# Patient Record
Sex: Male | Born: 1965 | Race: White | Hispanic: No | Smoking: Never smoker
Health system: Southern US, Community
[De-identification: ages and names within clinical notes are randomized; demographics above are authoritative.]

## PROBLEM LIST (undated history)

## (undated) DIAGNOSIS — R0600 Dyspnea, unspecified: Secondary | ICD-10-CM

## (undated) DIAGNOSIS — E1142 Type 2 diabetes mellitus with diabetic polyneuropathy: Secondary | ICD-10-CM

## (undated) DIAGNOSIS — M1990 Unspecified osteoarthritis, unspecified site: Secondary | ICD-10-CM

## (undated) DIAGNOSIS — J45909 Unspecified asthma, uncomplicated: Secondary | ICD-10-CM

## (undated) DIAGNOSIS — R569 Unspecified convulsions: Secondary | ICD-10-CM

## (undated) DIAGNOSIS — I272 Pulmonary hypertension, unspecified: Secondary | ICD-10-CM

## (undated) DIAGNOSIS — E119 Type 2 diabetes mellitus without complications: Secondary | ICD-10-CM

## (undated) DIAGNOSIS — G4733 Obstructive sleep apnea (adult) (pediatric): Secondary | ICD-10-CM

## (undated) DIAGNOSIS — J449 Chronic obstructive pulmonary disease, unspecified: Secondary | ICD-10-CM

## (undated) DIAGNOSIS — F32A Depression, unspecified: Secondary | ICD-10-CM

## (undated) DIAGNOSIS — G118 Other hereditary ataxias: Secondary | ICD-10-CM

## (undated) DIAGNOSIS — I10 Essential (primary) hypertension: Secondary | ICD-10-CM

## (undated) DIAGNOSIS — E785 Hyperlipidemia, unspecified: Secondary | ICD-10-CM

## (undated) DIAGNOSIS — G4730 Sleep apnea, unspecified: Secondary | ICD-10-CM

## (undated) DIAGNOSIS — R079 Chest pain, unspecified: Secondary | ICD-10-CM

## (undated) DIAGNOSIS — M75102 Unspecified rotator cuff tear or rupture of left shoulder, not specified as traumatic: Secondary | ICD-10-CM

## (undated) DIAGNOSIS — G4700 Insomnia, unspecified: Secondary | ICD-10-CM

## (undated) DIAGNOSIS — I209 Angina pectoris, unspecified: Secondary | ICD-10-CM

## (undated) HISTORY — DX: Type 2 diabetes mellitus without complications: E11.9

## (undated) HISTORY — PX: COLONOSCOPY: SHX174

## (undated) HISTORY — PX: URETER SURGERY: SHX823

## (undated) HISTORY — PX: HERNIA REPAIR: SHX51

## (undated) HISTORY — DX: Sleep apnea, unspecified: G47.30

## (undated) HISTORY — DX: Pulmonary hypertension, unspecified: I27.20

---

## 2008-12-29 ENCOUNTER — Ambulatory Visit
Admit: 2008-12-29 | Discharge: 2008-12-29 | Disposition: A | Discharge status: Home / Self Care | Payer: Self-pay | Source: Ambulatory Visit | Attending: Neurology | Admitting: Neurology

## 2009-01-06 ENCOUNTER — Ambulatory Visit
Admit: 2009-01-06 | Discharge: 2009-01-06 | Disposition: A | Discharge status: Home / Self Care | Payer: Self-pay | Source: Ambulatory Visit | Attending: Neurology | Admitting: Neurology

## 2009-03-11 ENCOUNTER — Ambulatory Visit: Payer: Self-pay | Admitting: Neurology

## 2009-03-22 NOTE — Progress Notes (Addendum)
 Reason For Visit   Dear Dr. Margette Fast     I saw [name] for a follow-up for his episodic ataxia and for his   activity triggered bilateral arm weakness and numbness in the Neurological   Clinic at [facility] this afternoon.  HPI   On his last visit [name] was complaining about worsening episodes of   ataxia secondary to his hereditary disorder for which he was actually   referred to the Neuromuscular Clinic at [facility]. But for some reason he never   got to schedule an appointment there.      He was also bothered by a worsening, mainly activity triggered bilateral   arm weakness, numbness (mainly in the ulnar territory) and soreness in the   setting of chronic degenerative changes of his cervical spine. Back in   October 2010 he had a well preserved strength, muscle bulk and only   slightly impaired sensory deficits in his extremities. He was also   complaining of a slowly progressing urinary urge-incontinence and erectile   dysfunction. According to [name] none of these symptoms have changed   or gotten worse over the course of the last few months.      In the meantime he had a MRI of his cervical spine as well as nerve   conduction studies in order to distinguish between a peripheral neuropathy   and a radiculopathy within the cervical spine. The MRI showed a cervical   spondylosis which is most pronounced at C6-C7 with right foraminal stenosis   and central canal narrowing but no signs of myelopathy or herniated discs.   These results are also consistent with the findings of the nerve conduction   study which shows a mild chronic right C7 radiculopathy and a predominantly   sensory polyneuropathy especially in the lower extremities. His HbA1c was   6% and the rest of his blood-work including thyroid function tests were   within normal limits.  Current Meds   Gabapentin 300 MG Capsule;TAKE 1 CAPSULE 3 TIMES DAILY.; RPT  Simvastatin 20 MG Tablet;TAKE 1 TABLET DAILY.; RPT  Lisinopril 20 MG  Tablet;TAKE 1 TABLET DAILY.; RPT  Atenolol 100 MG Tablet;TAKE 1 TABLET DAILY.; RPT.  Results   MR CERVICAL SPINE W/O CONTRST   06 Jan 2009 04:17 PM  -   MR CERVICAL SPINE W/O CONTRST  Exam Site: [facility]     MR cervical spine ICD9 code: 722.4     Jan 06, 2009 4:17:00 PM     MR IMAGING OF THE CERVICAL SPINE WITHOUT CONTRAST     CLINICAL INFORMATION: Patient complaining of intermittent bilateral  numbness and pain as well as weakness with exercise. Evaluation to  rule out myelopathy or herniated disc is requested.     PROCEDURE (MR301): Sagittal T1- and T2-weighted images were obtained  through the cervical spine, as well as axial proton density and  T2-weighted images.     COMPARISON: None.     FINDINGS: There is a straight spine. Alignment of the anterior,  middle, and posterior cervical elements is anatomic. There no  fractures or dislocations. Multilevel degenerative disease as  detailed below:     C2-C3: Central canal and neuroforamina are patent.     C3-C4: There is uncovertebral and facet hypertrophy resulting in  moderate to severe left foraminal stenosis. There is no central canal  stenosis.     C4-C5: There is no foramen or central canal stenosis.  C5-C6: There is mild to moderate right foraminal stenosis from facet  hypertrophy.     C6-C7: There is a right paracentral disc bulge resulting in moderate  to severe right foraminal stenosis and moderate central canal  stenosis with rotation of the cord on image 21 series 5.     The cord exhibits normal signal throughout its course without focal  abnormality. There is a T3 hemangioma. The regional soft tissues are  unremarkable.     IMPRESSION:     Spondylosis most pronounced at C6-C7 with right foraminal stenosis  and central canal narrowing.           Signed by: Lemar Livings - Radiologist  Signed by: Langston Reusing - Radiologist  Exam requested by: Della Goo M.D.  Vital Signs   Recorded by GOVAN,DEREK on 11 Mar 2009 03:41 PM  BP:157/93,  LUE,  Sitting,   HR: 78 b/min,   Weight: 243.0 lb,   Pain Scale: 6.  Physical Exam   GENERAL:    Well nourished, well-developed, in no apparent distress.   Heart sounds   regular.    Lungs clear.  Carotid exam reveals no bruit.  Peripheral pulses were   present.    Cervical spine tender to palpation.     CRANIAL NERVES:  Visual fields were full.  Fundoscopic exam was normal.    Pupils were 3 mm to 2 mm bilaterally.  EOMs were full without nystagmus.   Facial sensation and musculature were symmetric.  Palatal elevation was   symmetric, and tongue was midline.     MENTAL STATUS: Alert and oriented x 3 with no deficits of speech or   language.  Mood and affect were appropriate to situation.     MOTOR:  Bulk and tone were normal throughout.  Strength was 5/5 in   shoulders, biceps, triceps, hip flexors, ankle dorsi-and plantar flexors.    Finger-flexors 4+/5 bilateral. No pronator drift.     SENSATION:  Reduced pin and light touch over the ulnar territory   (C8/C7-dermatome) but preserved sense of vibration in all 4 extremities.     COORDINATION:  Finger to nose normal, positive Romberg but otherwise no   ataxia on exam.       GAIT:  Normal heel and toe gait. Very unsteady tandem and slightly unsteady   blind gait. Pt. not able to stand on one leg due to dysbalance.     REFLEXES:  Very brisk but symmetric upper and lower reflexes with downgoing   toes.  Assessment   Dennis Curtis is a 44 year old gentleman with a history of hereditary   episodic ataxia, chronic back an neck pain as well as left ulnar nerve   neuropathy who was found to have a right C7 radiculopathy secondary to a   cervical spondylosis with a right-sided foraminal stenosis. The combination   of his left ulnar neuropathy and the radiculopathy on the right could   explain his activity triggered numbness and weakness. The predominantly   sensory polyneuropathy in the lower extremities certainly contributes to   his gait  unsteadiness.       But none of these changes seem to be acute or severe enough to justify   acute surgical intervention. His strength and muscle bulk is well   preserved, his pain fairly well controlled with Gabapentin and there is no   sings of acute loss of bowel- or bladder-function. Regardless of the fact   that I told him that  his diagnosis is usually treated conservatively Mr.   Curtis wanted to get the opinion of a neurosurgeon on his radiculopathy.   He is also eager to be started on a treatment for his episodic ataxia since   they are significantly impairing his every day life.  Plan   1. Referral to Neurosurgery to discuss surgical options for his right C7   radiculopathy.  2. Referral to the Neuromuscular Clinic (preferred Dr. Ronal Fear who diagnosed   him initially) to discuss possible medical treatment of his episodic ataxia  3. Dennis Curtis was told to re-schedule a follow-up appointment with me in   clinic as needed since his work-up is completed so far.     Dennis Curtis has also been personally seen and evaluated by Dr. Ancil Boozer.     Please feel free to contact me with any questions or concerns!     Sincerely,  Della Goo  PGY-1 Neurology.  Signature   Electronically signed by: Della Goo  MD - Res.; 03/11/2009 9:08   PM EST; Co-author.  Electronically signed by: Kristian Covey  M.D.; 03/22/2009 10:36 AM EST;   Preceptor.

## 2009-04-12 ENCOUNTER — Ambulatory Visit
Admit: 2009-04-12 | Discharge: 2009-04-12 | Disposition: A | Discharge status: Home / Self Care | Payer: Self-pay | Source: Ambulatory Visit | Attending: Nephrology | Admitting: Nephrology

## 2009-04-12 ENCOUNTER — Ambulatory Visit: Payer: Self-pay | Admitting: Nephrology

## 2009-04-12 LAB — VITAMIN B12: Vitamin B12: 378 pg/mL (ref 211–946)

## 2009-04-13 LAB — GLUCOSE TOLERANCE, 2 HOURS
Glucose,2 HR: 87 mg/dL
Glucose,Fasting: 110 mg/dL

## 2009-04-13 LAB — METHYLMALONIC ACID, SERUM: Methylmalonic Acid: <0.1 umol/L (ref 0.00–0.40)

## 2009-04-20 NOTE — Progress Notes (Addendum)
 Neuromuscular Disease Clinic Note   Dear Dr. Consuello Bossier:           We had the pleasure of seeing your patient, Dennis Curtis, in the   Neuromuscular Disease Clinic today.  He is a 44 year old male being seen in   consultation for Episodic Ataxia type 1.     His symptoms of Episodic Ataxia was first noted at age 75-3. He was told   that he always walks stumble. He has had "dizzy spells" since he was in   primary school. His spells are described as sudden loss of muscle control,   whole body shaking, loss of balance, spinning vision both horizontal and   vertical directions, and loss of coordination of fine motor movement.   Occasionally, he reports double vision and nausea/vomiting. His spells   typically last 5-15 minutes. Increased physical activities, exercise,   stress, sleep deprivation, and fasting usually aggravate his spells. He   states that he has at least 3-4 spells per week, sometimes he has multiple   spells per day, particularly when he plays with his grandchildren. He feels   completely normal between the spells. He has not fallen. He has not taken   medications for his spells. He has no history of migraine headache,   seizure, or loss of consciousness. He was recruited for EA study in the   past but did not follow up with [place].     He was evaluated in our general neurology clinic for neck pain, bilateral   hand numbness and upper extremity fatigue. 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 He noted upper   extremity fatigue and hand numbness after pulling several pallets.   Sometimes, he dropped the pallets because of the arm fatigue and numbness.   He had no neck pain during these events. His arm fatigue resolved after   rest within 10-15 minutes. He has been off work since December 2009 because   of his arm fatigue.     He also has chronic neck pain. Pain is sharp  and sometimes shooting down to   right shoulder and arm. He reports constant numbness in both feet and   intermittent numbness in the left hand. He had EMG performed in 2008 which   showed a left moderate ulnar neuropathy at the elbow. Another EMG was   performed in 2010 which showed a mild right chronic C7 radiculopathy, a   predominantly sensory polyneuropathy, and myokymia on needle EMG testing.   Subsequently, he had MRI cervical spine done which showed cervical   spondylosis, which is most pronounced at C6-C7 with right foraminal   stenosis with no signs of myelopathy or herniated discs. His neuropathy   workup included normal hemoglobin A1C, fasting glucose 141 mg/dL, and   normal TSH.   .  Current Meds   Atenolol 100 MG Tablet;TAKE 1 TABLET DAILY.; RPT  Lisinopril 20 MG Tablet;TAKE 1 TABLET DAILY.; RPT  Simvastatin 20 MG Tablet;TAKE 1 TABLET DAILY.; RPT  Gabapentin 300 MG Capsule;TAKE 1 CAPSULE 3 TIMES DAILY.; RPT.  Allergies   No Known Drug Allergy.  PMH   1. Episodic ataxia type I  2. Hypertension  3. S/P herniorrhaphy and uretral surgery  4. Hypercholesterolemia  .  Family Hx   His family history is significant for Episodic Ataxia type 1 (EA-1).  His   mother, brother, his mother's sister and one of his cousins were diagnosed   and followed up in our clinic. His mother and aunt were also diagnosed with   Rheumatoid Arthritis. He has no children.  .  Personal Hx   He does not smoke cigarette or use recreational drug. He drinks beer   occasionally. He used to work for [employer] but has been off work for a   year. He is on DSS service and planning to apply for social disability.   Currently, he lives with his wife and step daughter. There is a high level   of stress going on as he has to take care of his wife who has CHF and a   bipolar disorder and they are going to move to 2-bedroom apartment very   soon.  .  ROS   CONSTITUTIONAL: Appetite good, no fevers, night sweats or weight loss  HEENT: No rhinorrhea or  post nasal drip, no tinnitus, blurry vision, double   vision, difficulty swalloing  CV: No chest pain, shortness of breath or peripheral edema  RESPIRATORY: No cough, wheezing or dyspnea  GI: No nausea/vomiting, abdominal pain, or change in bowel habits  GU: No dysuria, urgency or incontinence  MUSCULOSKELETAL: No muscle pain, joint pain, joint swelling  NEURO: No headache, weakness, diplopia, ptosis, dysphagia, dysarthria,   bowel and bladder symptoms. He has numbness in both feet and finger tips.  All other systems reviewed are unremarkable.  .  Vital Signs   Recorded by Fabbozzi-Tomaka,Delia on 12 Apr 2009 08:59 AM  BP:164/100,  RUE,  Sitting,   HR: 71 b/min,   Weight: 244.5 lb,   Pain Scale: 4.  Physical Exam   General examination:  He is in no acute distress and cooperative.  Lungs   are clear by auscultation.  Cardiac exam reveals normal heart sound with no   murmur. Peripheral pulses are intact. Atrophic skin change is noted in both   legs.      Neurological Examination: He is alert and fully oriented.  His speech is   normal.  Memory and attention are intact.       Cranial Nerves:  Visual field is intact. Fundi are sharp but A:V ratio is   2:3. Extraocular movement is intact. There is no overshoot on saccadic eye   movement but his pursuit is not smooth. No nystagmus is noted in all   directions. He has subtle eyelid and lip myokymia. Facial sensation is   intact to light touch. Lid and lip closure strength is normal. Hearing is   intact to finger rubbing.  Palate, jaw, shoulder shrug and tongue strength   is full. Speech is mildly dysarthric. There is no tongue atrophy or   fasciculation.     Motor: He has bilateral atrophy of EDB muscles. Myokymia is again noted in   bilateral lower extremities. Muscle tone is normal. Manual muscle testing   is normal except he could not spread his toes. He is able to perform deep   knee bends, toe walking and heel walking with no difficulty.     Sensory:  Decreased pin  and light touch sensation to ankles. Proprioception   and vibration are intact both toes.     DTR (R/L): 3/3 biceps, 2/2 triceps, 3/3 brachioradialis, 2/2 patella, and   1/1 archilles reflexes. Plantar response is flexor.      Coordination: Fine tremors are noted in both upper extremity. Finger to   nose,  finger tapping and heel to shin are intact. There is no   dysdiadochokinesia. He is able to perform tandem with difficulty and   requires partial support.      Gait: His casual gait is narrow and appears to be normal. Romberg is   present both eyes close and open.  .  Assessment   1. Episodic Ataxia type 1 (EA-1)     COMMENT:      His episodic ataxia spells are clearly associated with physical activities   and stress. Unfortunately, EA type 1 has unpredictable response to   acetazolamide compare to EA type 2. Some patients do response with   antiepileptic agents and sometimes with low dose anxiolytic agents. Since   Mr. Bourque is already taking low dose gabapentin for his neck pain,   titrating the dose might also benefits for his EA spells. If this does not   help, carbamazepine or low dose anxiolytic agent should be considered as   the next step.      His upper extremity fatigue is unclear etiology but less likely related   with EA spells given he has no other symptoms of his spell during the   fatigue. Perhaps, this symptom is contributed by C7 radiculopathy and ulnar   neuropathy. There is evidence of chronic denervation observed from the   muscle biopsy findings of EA-1 patients. His sensory neuropathy might be   attributed to his underlying channelopathy, however reversible and more   common causes of sensory neuropathy need to be excluded as well given he   has impaired fasting glucose.     2. Peripheral Neuropathy     3. Hypertension - inadequately controlled     4. Hypercholesterolemia     5. Left ulnar neuropathy     6. Right C7 radiculopathy  .  Plan   1. We provided him lab requisition for 2-hour  glucose tolerance test,   vitamin B12 and methylmalonic acid for neuropathy workup.     2. We provided him an instruction to slowly titrate his gabapentin dose. We   asked him to add 300 mg capsule to his e

## 2009-05-03 ENCOUNTER — Ambulatory Visit
Admit: 2009-05-03 | Discharge: 2009-05-03 | Disposition: A | Discharge status: Home / Self Care | Payer: Self-pay | Source: Ambulatory Visit | Attending: Nephrology | Admitting: Nephrology

## 2009-06-24 ENCOUNTER — Ambulatory Visit: Payer: Self-pay | Admitting: Neurosurgery

## 2009-06-24 ENCOUNTER — Ambulatory Visit: Payer: Self-pay | Admitting: Rehabilitative and Restorative Service Providers"

## 2009-07-08 ENCOUNTER — Ambulatory Visit: Payer: Self-pay | Admitting: Neurosurgery

## 2009-07-08 ENCOUNTER — Ambulatory Visit: Payer: Self-pay | Admitting: Rehabilitative and Restorative Service Providers"

## 2009-07-09 LAB — PAIN CLINIC PROFILE
Amphetamine,UR: NEGATIVE
Benzodiazepinen,UR: NEGATIVE
Cocaine/Metab,UR: NEGATIVE
Opiates/Oxycodone,Ur: NEGATIVE
THC Metabolite,UR: NEGATIVE

## 2009-07-10 LAB — FENTANYL, URINE: Fentanyl,UR: NEGATIVE

## 2009-07-20 ENCOUNTER — Ambulatory Visit: Payer: Self-pay | Admitting: Neurosurgery

## 2009-08-09 ENCOUNTER — Ambulatory Visit: Payer: Self-pay | Admitting: Neurosurgery

## 2009-09-27 ENCOUNTER — Ambulatory Visit: Payer: Self-pay | Admitting: Neurosurgery

## 2009-10-28 ENCOUNTER — Ambulatory Visit: Payer: Self-pay | Admitting: Neurosurgery

## 2009-11-01 NOTE — Progress Notes (Signed)
Dennis Curtis follows up after myofascial trigger point injections  targeting the paracervical muscles of the superior trapezius and splenius  capitis on Jul 20, 2009.  The patient was lost to follow-up.  Today, he  recalls marked reduction in pain intensity for 3 weeks after the procedure  where he was essentially pain free from the "stabbing" neck pain.  He  experienced improvement in range of motion and activity tolerance.    The patient describes persistent axial predominant posterior cervical pain,  rating his pain intensity today at 5/10.  He states he was unable to follow  through with physical therapy or follow-up visits due to a lack of  transportation.  There is no mass transit where he resides.  He has  otherwise been well.  He denies any change in his health status, falls, or  trauma.  He denies any change in his pain quality or location.    PHYSICAL EXAMINATION:  Blood pressure:  148/102.  Pulse:  74.  Respirations:  16.  Afebrile.  This is a pleasant overweight adult male who  is sitting upright in the examination chair.  No evidence of sedation or  psychomotor slowing.  Affect is broad and stable.  Neck is supple.  Range  of motion is full with flexion and extension, decreased with rotation to  the left with report of increased right-sided neck pain.  Lhermitte's is  negative.  Hoffmann's is positive.  Strength is full for all muscle groups  in the bilateral upper and lower extremities.  Tone is normal.  Gait is  steady, narrow based, and nonantalgic.  He can arise to his heels and  toes.    IMPRESSION/PLAN:  This is a 44 year old male with cervical spondylosis at  C6-C7 and central and neural foraminal stenosis with paracervical  myofascial pain.  We agreed to repeat the myofascial trigger point  injections due to previous positive response.  The patient will schedule a  physical therapy visit on the day of the injection for a refresher on the  physical therapy exercises and follow up with Physical  Therapy.  He was  encouraged to perform the physical therapy exercises at home on a regular  basis.    Thank you for allowing [place] to assist in the care of this patient.                                                                     Dictated by:                                                               Kirke Shaggy, NP  Electronically Signed and Finalized by  Talbert Forest  Rennis Golden, NP 10/31/2009 22:10                                                                                                                             ___________________________________________                                                               Kirke Shaggy, NP  DD:   10/28/2009  DT:   10/29/2009 11:39 A  ZOX/[id number]  [id number]    cc:   Karen Kays, MD        Edmund Hilda, MD

## 2009-11-02 ENCOUNTER — Ambulatory Visit: Payer: Self-pay | Admitting: Neurosurgery

## 2009-11-02 ENCOUNTER — Encounter: Payer: Self-pay | Admitting: Gastroenterology

## 2009-11-02 ENCOUNTER — Ambulatory Visit: Payer: Self-pay | Admitting: Rehabilitative and Restorative Service Providers"

## 2009-11-03 DIAGNOSIS — M542 Cervicalgia: Secondary | ICD-10-CM | POA: Insufficient documentation

## 2009-11-11 ENCOUNTER — Ambulatory Visit: Payer: Self-pay | Admitting: Rehabilitative and Restorative Service Providers"

## 2009-11-29 ENCOUNTER — Ambulatory Visit: Payer: Self-pay | Admitting: Neurosurgery

## 2009-12-02 ENCOUNTER — Ambulatory Visit: Payer: Self-pay | Admitting: Pain Medicine

## 2009-12-12 NOTE — Progress Notes (Signed)
 Dennis Curtis follows up regarding persistent posterior cervical pain.  The patient underwent myofascial trigger point injections on November 02, 2009, targeting the bilateral superior trapezii and levator scapulae on the  left.  The patient experienced transient reduction in pain intensity and  improvement in range of motion.  He states the pain returned to baseline.  He has been inconsistent in follow up with physical therapy that he  attributes to lack of transportation and his "poor memory."  The patient  denies any radiation of pain to the upper extremities.  He denies any  alteration in gait or change in bowel or bladder function.    Current medications: Gabapentin 300 mg 1 tab t.i.d., lisinopril 20 mg  daily, simvastatin 20 mg daily, atenolol 100 mg daily.    Physical examination: Blood pressure 157/98.  Pulse 75.  Respirations 15.  Afebrile.  He rates his present pain intensity as 8/10.  This is a  pleasant, adult male who is sitting upright in the exam chair.  His affect  is broad and stable.  Speech is fluent and nondysarthric.  There are no  significant pain behaviors.  His neck is supple.  Cervical range of motion  is full with flexion and extension.  Lhermitte's is negative. Hoffmann's is  negative.  He reports pain with palpation of the myofascial structures in  the cervical region.  Strength is full in the C5 through T1 and L3 through  S1 dermatomes.  Hoffmann's is positive (due to myokymia).  Toes are  downgoing.  Gait is steady, narrow based, and nonantalgic.    Impression/plan: This is a 44 year old male with persistent myofascial  cervical pain in the context of myokymia.  Recommend a TENS unit and resume  physical therapy.  Prescriptions were provided for both.  We will follow up  with him in 3 months' time.                                                                     Dictated by:                                                               Dennis Shaggy, Dennis Curtis  Electronically Signed and Finalized by  Dennis Shaggy, NP 12/12/2009 12:25  ___________________________________________                                                               Dennis Shaggy, Dennis Curtis  DD:   12/03/2009  DT:   12/06/2009 10:20 A  [id number]  [id number]    cc:   Ferdie Ping, MD        Rickard Patience, MD        Edmund Hilda, MD

## 2010-02-01 ENCOUNTER — Ambulatory Visit: Payer: Self-pay | Admitting: Pain Medicine

## 2010-03-14 NOTE — Procedures (Signed)
Procedure   [facility]  Procedure Note  [facility] Department of Neurosurgery  [address] [place], [place]  [address]  Phone: [phone]  Fax: [phone]     Patient Name: Dennis Curtis [facility]  Medical Record Number: [id number]  Date of Procedure: 11/02/2009  Location of Procedure: [facility] / [address] [facility]     Procedure Name:  Trigger Point Injection  Provider:  Trude Mcburney MD  Indication: Right and LeftSuperior TrapeziusLevator Scapulae (left)  ICD9:  723.1  CPT:  20553     There has been no interval change in the anatomic distribution, intensity,   or quality of the patients pain since the last evaluation. As per the   treatment plan outlined at that visit, the patient undergoes myofascial   trigger point injection today. The patient is not currently on any blood   thinning medication and has had no recent symptoms or signs of infection   including fever or chills.     The procedure was reviewed with the patient in detail including the risks   of bleeding, infection, nerve injury, failure to relieve pain, increased   pain, allergic reaction,  collapsed lung, and headache. The patient   demonstrated understanding. Informed consent was obtained.      PROCEDURE:       The patient was prepped in the typical sterile fashion with betadine.  4   reproducible myofascial trigger points over the target muscles on the:   right and left superior trapezius ], levator scapulae  were identified and   marked.     Using a 27 gauge 1-1/4" needle, the bevel was advanced in the belly of the   muscle at a total of 4 sites and 5 cc of 0.50 percent bupivicaine and 40 mg   of methylprednisolone was delivered after a muscle twitch was elicited.    There was negative aspiration for heme prior to injection.      The patient tolerated the procedure well.  He remained hemodynamically   stable throughout. He was discharged at her baseline neurologic state    following check of  vital signs and review of standard discharge   instructions.     The patient will follow up in 4 weeks time.  Signature   Electronically signed by: Trude Mcburney  MD Attend.; 11/02/2009 1:21 PM EST.

## 2010-03-14 NOTE — Procedures (Signed)
Procedure   [facility]  Procedure Note  [facility] [place] Department of Neurosurgery  [address] [place], [place]  [address]  Phone: [phone]  Fax: [phone]     Patient Name: Dennis Curtis [facility]  Medical Record Number: [id number]  Date of Procedure: 07/20/2009  Location of Procedure: [facility] / [address] [facility] [place]     Procedure Name:  Trigger Point Injection  Provider:  Trude Mcburney MD  Indication: Right and LeftSuperior TrapeziusSplenius Capitis  ICD9:  723.1  CPT:  20553     There has been no interval change in the anatomic distribution, intensity,   or quality of the patients pain since the last evaluation. As per the   treatment plan outlined at that visit, the patient undergoes myofascial   trigger point injection today.  The patient has neurophysiologic evidence   of a remote C7 radiculopathy but his symptoms are axial predominant, evoked   by direct palpation of the paraspinal muscles, and not clearly lateralized   (i.e. bilateral) with his EMG/NCS. For this reason, a myofascial target for   the pain was addressed today. The patient is not currently on any blood   thinning medication and has had no recent symptoms or signs of infection   including fever or chills.     The procedure was reviewed with the patient in detail including the risks   of bleeding, infection, nerve injury, failure to relieve pain, increased   pain, allergic reaction,  collapsed lung, and headache. The patient   demonstrated understanding. Informed consent was obtained.      PROCEDURE:       The patient was prepped in the typical sterile fashion with betadine.  4   reproducible myofascial trigger points over the target muscles on the:   right and leftsuperior trapezius splenius capitis],   were identified and   marked.     Using a 27 gauge 1-1/4" needle, the bevel was advanced in the belly of the   muscle at a total of 4 sites  and 5 cc of 0.50 percent  bupivicaine and 40   mg of methylprednisolone was delivered after a muscle twitch was elicited   in the aforementioned muscle groups.   There was negative aspiration for   heme prior to injection.      The patient tolerated the procedure well.  He remained hemodynamically   stable throughout. He was discharged at her baseline neurologic state   following check of  vital signs and review of standard discharge   instructions.     The patient will follow up in 4 weeks time.  Signature   Electronically signed by: Trude Mcburney  MD Attend.; 07/20/2009 9:11 AM EST.

## 2010-03-14 NOTE — Miscellaneous (Unsigned)
 Continuity of Care Record  Created: todo  From: ,   From:   From: [employer], EHR v10.2.7.53  To: Tressie Stalker  Purpose: Patient Use;       Problems  Diagnosis: Cervicalgia (723.1)   Diagnosis: Diabetes Mellitus (250.00)   Problem: Injection Of Trigger Point(S) Three Or More Muscle Group(S)    Alerts  Allergy - No Known Drug Allergy     Medications  Atenolol 100 MG Tablet; TAKE 1 TABLET DAILY. ; RPT   Gabapentin 300 MG Capsule; TAKE 1 CAPSULE 3 TIMES DAILY. ; RPT   Lisinopril 20 MG Tablet; TAKE 1 TABLET DAILY. ; RPT   MetFORMIN HCl 500 MG Tablet ; RPT   Physical Therapy; myofascial pain , lumbar strenghtening and stretching   program ; Rx   Physical Therapy; TENS unit for axial cervical myofascial pain ; Rx   Physical Therapy; Physical Therapy: cervical myofascial painCervical   Traction ; Rx   Simvastatin 20 MG Tablet; TAKE 1 TABLET DAILY. ; RPT
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
## 2010-03-28 ENCOUNTER — Ambulatory Visit: Payer: Self-pay | Admitting: Neurosurgery

## 2010-04-21 ENCOUNTER — Ambulatory Visit: Payer: Self-pay | Admitting: Neurosurgery

## 2010-04-21 DIAGNOSIS — E119 Type 2 diabetes mellitus without complications: Secondary | ICD-10-CM | POA: Insufficient documentation

## 2010-04-21 NOTE — Letter (Signed)
 April 21, 2010        RE:   Dennis Curtis, Dennis Curtis  DOB:  Apr 28, 1965  Unit#: [id number]    Dennis Curtis reports significant improvement in his left-sided axial  predominant posterior cervical pain.  He attributes this benefit to a  course of physical therapy, as well as the trigger point injection he  underwent 7 months ago in August.  He continues to have paresthesias in his  hands bilaterally.  His symptoms are somewhat more pronounced on the left  than on the right.  He denies any new weakness.  The tingling occurs in  both hands.  It is not necessarily associated with 1 posture or activity.  The patient denies any new weakness in his leg, change in his bowel or  bladder habit.  He has had no recurrent trauma to his head, neck or back.  He denies any stiffness in his legs or difficulty with gait.    MEDICATIONS:  He remains on gabapentin.  He is unsure of the analgesic  benefit from this medication.  Today, we discussed the possible taper of  this.  Over the past 10 days he has been treated by Dr. Margette Fast for acute  low back strain.  He has been treated with hydrocodone on a twice daily  basis.  The patient notes some benefit from this.  He denies any aberrant  drug taking behavior or significant sedation.  He is currently not on any  other antiinflammatory medications.  His other medications include  atenolol, lisinopril and metformin, as well as simvastatin.    PHYSICAL EXAMINATION:  On exam, the patient is a pleasant cooperative adult  male, discussing his care for his wife, who has multiple medical problems,  including CHF and bipolar disorder, as well as COPD.  His blood pressure is  158/105.  His heart rate is 69.  He is a pleasant adult male with a broad  stable affect who is comfortable appearing.  His pain is rated 1/10 in  intensity.  He is alert.  He is not sedated.  His speech is fluent and  nondysarthric.  His attention is intact.  His neck is supple, with full  range of motion.  There is  no myofascial tenderness over the posterior  paraspinal region.  There is no focal weakness of the biceps, triceps,  brachioradialis, intrinsics of the hand on the left or the right.  Here are  no adventitious movements.  Tone is normal.  Reflexes are 2+ and symmetric  at the C5, C6 and C7.  Reflexes are brisk in the lower extremities.  Toes  are downgoing.  Gait is narrow based and steady.  Mild limitation of range  of motion of the lumbar spine on forward flexion due to pain.    IMPRESSION:  This is a 45 year old gentleman with episodic ataxia and  cervical myofascial pain, which is largely resolved at this point with  physical therapy.  He has had robust analgesic benefit from trigger point  injection last August, the benefit of which has persisted.    More recently, he has experienced acute low back strain.  He has been  treated with hydrocodone by his primary care physician for these symptoms.  Today, I have recommended a course of physical therapy with structured  exercise guidance for that problem.  There are no new focal neurologic  deficits on exam today consistent with lumbar radiculopathy.    Lastly, we discussed a possible taper of his gabapentin, given  the  uncertain analgesic benefit for his upper extremity paresthesias.  He will  decrease this in 300 mg increments.  Should he notice marked worsening of  his symptom pattern, he will discontinue the taper and restore the  medication to its prior level.  I have taken the liberty of providing him  with a prescription for physical therapy today to be performed nearby in  [place].  We will plan to follow up with him in 4 to 5 months' time or  sooner if his low back symptoms do not resolve or he has difficulty with  the taper of his gabapentin.    Sincerely,              Electronically Signed and Finalized by  Dennis Benes, MD 05/22/2010 16:24  ____________________________________  Dennis Benes, MD          DD:   04/21/2010  DT:   04/21/2010  1:03  P  DVI:  [id number]  [id number]    cc:   Dennis Patience, MD        Dennis Hilda, MD

## 2010-04-22 LAB — PAIN CLINIC PROFILE
Amphetamine,UR: NEGATIVE
Benzodiazepinen,UR: NEGATIVE
Cocaine/Metab,UR: NEGATIVE
Opiates/Oxycodone,Ur: NEGATIVE
THC Metabolite,UR: NEGATIVE

## 2010-09-06 ENCOUNTER — Encounter: Payer: Self-pay | Admitting: Gastroenterology

## 2010-10-20 ENCOUNTER — Ambulatory Visit: Payer: Self-pay | Admitting: Neurosurgery

## 2010-10-24 ENCOUNTER — Ambulatory Visit: Payer: Self-pay | Admitting: Neurosurgery

## 2010-12-08 ENCOUNTER — Ambulatory Visit: Payer: Self-pay | Admitting: Neurosurgery

## 2010-12-08 ENCOUNTER — Encounter: Payer: Self-pay | Admitting: Neurosurgery

## 2010-12-08 VITALS — BP 139/79 | HR 65 | Resp 15 | Ht 65.0 in | Wt 254.0 lb

## 2010-12-08 DIAGNOSIS — M549 Dorsalgia, unspecified: Secondary | ICD-10-CM | POA: Insufficient documentation

## 2010-12-08 LAB — POCT URINE DRUG SCREEN 5 PANEL
COC (300ng/mL): NEGATIVE
Kit Lot: 1205063
MTD (300ng/mL): NEGATIVE
OPI (300ng/mL): POSITIVE
OXY (100ng/mL): NEGATIVE
THC (50ng/mL): NEGATIVE

## 2010-12-10 NOTE — Progress Notes (Signed)
History of Present Illness:  Dennis Curtis follows up for posterior cervical pain with intermittent radiation of pain to the upper extremities.  The patient rates his pain intensity today as 4/10.  He endorses significant reduction in pain intensity with examination of physical therapy and myofascial trigger point injection on 10/15/10.  The tingling in the arms has resolved.  The patient denies any associated weakness or numbness in the upper extremities.  He denies any activity limitation due to pain.  He is working full-time without restriction.  He has been  well denying any change in health status a recent falls or trauma.      Current Medications:  Current Outpatient Prescriptions   Medication   . HYDROcodone-acetaminophen (VICODIN) 5-500 MG per tablet   . losartan-hydrochlorothiazide (HYZAAR) 100-25 MG per tablet   . atenolol (TENORMIN) 100 MG tablet   . lisinopril (PRINIVIL,ZESTRIL) 20 MG tablet   . irbesartan (AVAPRO) 300 MG tablet   . metFORMIN (GLUCOPHAGE) 500 MG tablet   . simvastatin (ZOCOR) 20 MG tablet     Physical exam:  Blood pressure 139/79, pulse 65, resp. rate 15, height 1.651 m (5\' 5" ), weight 115.214 kg (254 lb), SpO2 94.00%.  This is a pleasant, adult male who is sitting upright in the exam chair.   His affect is broad and stable. Speech is fluent and nondysarthric. There are no   significant pain behaviors. His neck is supple.   Cervical range of motion is full with flexion and extension.  Lhermitte's is negative. Hoffmann's is positive (myokymia).   He denies  pain with palpation of the myofascial structures in the cervical region.   Strength is full in the C5 through T1 and L3 through S1 dermatomes.    Toes are downgoing. Gait is steady, narrow based, and nonantalgic.     Impression/plan:   This is a 45 year old male with reduction in cervical myofascial cervical pain in the context of myokymia.  The combination of myofascial trigger point injection physical therapy and used a TENS  unit is proven effective.  Recommend continuing home physical therapy exercises. We agreed he will follow up on a prn basis.

## 2014-02-26 ENCOUNTER — Encounter: Payer: Self-pay | Admitting: Gastroenterology

## 2014-02-26 ENCOUNTER — Telehealth: Payer: Self-pay

## 2014-02-26 NOTE — Telephone Encounter (Signed)
Possible Ataxia clinic   [facility]  Sent notes to scanning    [facility] from   [facility]  [address]  [place] [id number]  P: [phone]

## 2014-03-24 NOTE — Telephone Encounter (Signed)
Ok to schedule in Ataxia per kelly    Pt scheduled 06/17/14 @ 1230    Ataxia questionnaire completed and placed under documentation only on 03/24/14

## 2014-03-24 NOTE — Progress Notes (Signed)
[facility]    1. Intake information provided by: Dennis Curtis  2. 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 Who recommended an appointment in the ataxia clinic? PCP Dr Dennis Curtis  4. Has the patient ever had a brain MRI?  a. If yes, please have the ordering doctor send a CD to our office prior to the visit or bring the CD to the visit --pt not sure will call PCP and check he will have it sent if he has  5. Has the patient or a family member ever had genetic testing?  a. If yes, please send a copy of the report to our office prior to the visit or bring the result with you to the visit  --pt states he has not but his mother has    Scheduled: 06/17/14 @ 1230  Confirmed type of insurance: medicare and Medicaid per pt  Legal guardian or parents name: self  Contact number: [phone]  Patient told of location of appointment -  yes   Map needed? Mailed map to pt along with new patient packet  Referred elsewhere:   If yes, where    Explanation to give to caller:  The visit will consist of a personal, family, medical history review, and a physical exam. Please think of the goals and expectations you have for the hereditary ataxia clinic visit prior to your appointment so that we make sure all questions are answered during your visit. To maximize your time at the hereditary ataxia clinic visit, please bring any additional information along, including MRI CDs, any genetic testing on you or a family member, and a current medication list. If possible, please send these things to our office in advance of your visit.     You (or your child) will meet with members of the Hereditary Ataxia Team which includes a neurologist, genetic counselor, physical therapist, and Child psychotherapistsocial worker. New patient appointments typically take approximately two hours, so please plan accordingly.

## 2014-06-17 ENCOUNTER — Ambulatory Visit: Payer: Self-pay | Admitting: Pediatric Neurology

## 2014-06-17 ENCOUNTER — Encounter: Payer: Self-pay | Admitting: Pediatric Neurology

## 2014-06-17 VITALS — BP 132/61 | HR 77 | Ht 65.0 in | Wt 260.0 lb

## 2014-06-17 DIAGNOSIS — R270 Ataxia, unspecified: Secondary | ICD-10-CM

## 2014-06-17 NOTE — Progress Notes (Signed)
Physical therapy functional assessment:    Safety: walk-in shower, handicapped accessible with grab bars and a seat    Gait: wide, stiff legged, ataxic (difficulty with tandem without min assist of 1/ contact guard), able to heel/ toe    Functional Mobility: standing up from chair with posterior lean and use of bilateral upper extremity, difficulty without using arms to stand up. Of note patient with posterior lean and posterior pelvic tilt when testing of legs by MD. Difficulty taking socks off when asked to do to body mass.     Exercises: reviewed and provided handout for standing calf stretch, seated hamstring stretch, supine knee to chest, supine pelvic rotations, sitting marches or supine, sitting knee extensions.    Recommendations: water walking and stationary bicycle use 2-3 times per week to become aerobic. Recommend use of a straight cane when ambulating on uneven surfaces, longer distances. Discussed light in bathroom and hallway (keeps room dark).       Dennis HallLindsay Lesean Curtis, PT    Phone # [phone]  Fax # [phone]
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
## 2017-01-02 ENCOUNTER — Ambulatory Visit (INDEPENDENT_AMBULATORY_CARE_PROVIDER_SITE_OTHER): Payer: Medicare HMO | Admitting: Urology

## 2017-01-02 ENCOUNTER — Encounter: Payer: Self-pay | Admitting: Urology

## 2017-01-02 VITALS — BP 159/108 | HR 91 | Ht 65.0 in | Wt 263.1 lb

## 2017-01-02 DIAGNOSIS — L918 Other hypertrophic disorders of the skin: Secondary | ICD-10-CM | POA: Diagnosis not present

## 2017-01-02 NOTE — Progress Notes (Signed)
01/02/2017 10:58 AM   Mario ClarkWilliam Aust 1965-09-04 [id number]  Referring provider: Preston Fleetingevelo, Adrian Mancheno, MD [address] [place], [place] [id number]  Chief Complaint  Patient presents with  . New Patient (Initial Visit)    Penile lesion    HPI:  Pt with a small brown, elongated lesion on the posterior scrotum. Sometimes it drains. He's noticed for many years even back to when he was a child. He thought it was a "birth mark". It is slowly getting bigger. It becomes painful at times. He is disabled due to episodic ataxia.   Modifying factors: There are no other modifying factors  Associated signs and symptoms: There are no other associated signs and symptoms Aggravating and relieving factors: There are no other aggravating or relieving factors Severity: Moderate Duration: Persistent  He could not leave a specimen. He has either "yellow or clear" urine. No gross hematuria.   PMH: No past medical history on file.  Surgical History: Past Surgical History:  Procedure Laterality Date  . HERNIA REPAIR    . URETER SURGERY      Home Medications:  Allergies as of 01/02/2017   No Known Allergies     Medication List       Accurate as of 01/02/17 10:58 AM. Always use your most recent med list.          amLODipine 10 MG tablet Commonly known as:  NORVASC Take 10 mg by mouth daily.   atenolol 100 MG tablet Commonly known as:  TENORMIN Take 100 mg by mouth daily.   atorvastatin 40 MG tablet Commonly known as:  LIPITOR Take 40 mg by mouth daily.   furosemide 40 MG tablet Commonly known as:  LASIX Take 40 mg by mouth.   glucose blood test strip 1 each by Other route as needed for other. Use as instructed   hydrochlorothiazide 25 MG tablet Commonly known as:  HYDRODIURIL Take 25 mg by mouth daily.   lisinopril 40 MG tablet Commonly known as:  PRINIVIL,ZESTRIL Take 40 mg by mouth daily.   metFORMIN 1000 MG tablet Commonly known as:  GLUCOPHAGE Take  1,000 mg by mouth 2 (two) times daily with a meal.   ONETOUCH DELICA LANCETS FINE Misc by Does not apply route.   prazosin 2 MG capsule Commonly known as:  MINIPRESS Take 2 mg by mouth at bedtime.   traZODone 50 MG tablet Commonly known as:  DESYREL Take 50 mg by mouth at bedtime.       Allergies: No Known Allergies  Family History: Family History  Problem Relation Age of Onset  . Prostate cancer Neg Hx   . Bladder Cancer Neg Hx   . Kidney cancer Neg Hx     Social History:  reports that he has never smoked. He has never used smokeless tobacco. He reports that he drinks alcohol. He reports that he does not use drugs.  ROS: UROLOGY Frequent Urination?: Yes Hard to postpone urination?: Yes Burning/pain with urination?: No Get up at night to urinate?: Yes Leakage of urine?: Yes Urine stream starts and stops?: Yes Trouble starting stream?: No Do you have to strain to urinate?: No Blood in urine?: No Urinary tract infection?: No Sexually transmitted disease?: No Injury to kidneys or bladder?: Yes Painful intercourse?: No Weak stream?: No Erection problems?: No Penile pain?: No  Gastrointestinal Nausea?: No Vomiting?: No Indigestion/heartburn?: No Diarrhea?: Yes Constipation?: No  Constitutional Fever: No Night sweats?: Yes Weight loss?: No Fatigue?: Yes  Skin Skin rash/lesions?:  No Itching?: No  Eyes Blurred vision?: Yes Double vision?: Yes  Ears/Nose/Throat Sore throat?: No Sinus problems?: No  Hematologic/Lymphatic Swollen glands?: No Easy bruising?: No  Cardiovascular Leg swelling?: Yes Chest pain?: No  Respiratory Cough?: Yes Shortness of breath?: Yes  Endocrine Excessive thirst?: Yes  Musculoskeletal Back pain?: Yes Joint pain?: Yes  Neurological Headaches?: Yes Dizziness?: Yes  Psychologic Depression?: Yes Anxiety?: Yes  Physical Exam: BP (!) 159/108 (BP Location: Right Arm, Patient Position: Sitting, Cuff Size:  Normal)   Pulse 91   Ht 5\' 5"  (1.651 m)   Wt 119.3 kg (263 lb 1.6 oz)   BMI 43.78 kg/m   Constitutional:  Alert and oriented, No acute distress. HEENT: [place] AT, moist mucus membranes.  Trachea midline, no masses. Cardiovascular: No clubbing, cyanosis, or edema. Respiratory: Normal respiratory effort, no increased work of breathing. GI: Abdomen is soft, nontender, nondistended, no abdominal masses GU: No CVA tenderness. Penis: buried but circumcised, no lesions Scrotum: normal, on the very posterior scrotum there is a flesh colored skin tag, smooth, about 15 mm on a narrow stalk. Testes descended and palpably normal.  DRE: Prostate 30 g and benign, no hard area or nodule  Skin: No rashes, bruises or suspicious lesions. Lymph: No cervical or inguinal adenopathy. Neurologic: Grossly intact, no focal deficits, moving all 4 extremities. Psychiatric: Normal mood and affect.  Laboratory Data: No results found for: WBC, HGB, HCT, MCV, PLT  No results found for: CREATININE  No results found for: PSA1  No results found for: TESTOSTERONE  No results found for: HGBA1C  Urinalysis No results found for: SPECGRAV, PHUR, COLORU, APPEARANCEUR, LEUKOCYTESUR, PROTEINUR, GLUCOSEU, KETONESU, RBCU, BILIRUBINUR, UUROB, NITRITE  No results found for: LABMICR, WBCUA, RBCUA, LABEPIT, MUCUS, BACTERIA   No results found for this or any previous visit. No results found for this or any previous visit. No results found for this or any previous visit. No results found for this or any previous visit. No results found for this or any previous visit. No results found for this or any previous visit. No results found for this or any previous visit. No results found for this or any previous visit.  Assessment & Plan:    acrochordon - it is big enough to cause some issue with pain and bleeding, but small enough it could be excised or frozen in office and we discussed the nature r/b/a to removal/destruction.  He would do well in lateral decubitus with a pillow between his legs (the skin tag would be accessible). He understands one of my associates may be doing the procedure.   There are no diagnoses linked to this encounter.  No Follow-up on file.  Jerilee Field, MD  [facility] [address], [address] [place], [place] [address] [phone]

## 2017-01-22 ENCOUNTER — Other Ambulatory Visit: Payer: Self-pay | Admitting: Urology

## 2017-01-22 ENCOUNTER — Encounter: Payer: Self-pay | Admitting: Urology

## 2017-01-22 ENCOUNTER — Ambulatory Visit: Payer: Medicare HMO | Admitting: Urology

## 2017-01-22 VITALS — BP 151/80 | HR 103 | Ht 65.0 in | Wt 266.5 lb

## 2017-01-22 DIAGNOSIS — L918 Other hypertrophic disorders of the skin: Secondary | ICD-10-CM

## 2017-01-22 NOTE — Progress Notes (Signed)
   01/22/17  CC: No chief complaint on file.   HPI:  Pt returns for acrochordon removal. He's been well.   Blood pressure (!) 151/80, pulse (!) 103, height 5\' 5"  (1.651 m), weight 120.9 kg (266 lb 8 oz). NED. A&Ox3.   No respiratory distress   Abd soft, NT, ND Normal phallus with bilateral descended testicles  Procedure Note Patient identification was confirmed, informed consent was obtained, and patient was prepped using Betadine solution.  Procedure: He was placed in lateral decubitus and the posterior scrotum was prepped. 1% lidocaine instilled under the lesion. A 10 mm flesh colored lesion was excised with an 11 blade. The incision was closed with a running 3-0 chronic suture. A sterile dressing was applied.    Post-Procedure: - Patient tolerated the procedure well  Assessment/ Plan: acrochordon excision - it was sent to pathology. Will notify pt of results.

## 2017-01-31 LAB — PATHOLOGY

## 2017-02-02 ENCOUNTER — Other Ambulatory Visit: Payer: Self-pay | Admitting: Urology

## 2017-02-07 ENCOUNTER — Telehealth: Payer: Self-pay

## 2017-02-07 NOTE — Telephone Encounter (Signed)
Mario Castro, [name], MD  Skeet LatchWatkins, Chelsea C, LPN        Notify patient the biopsy we took from the scrotum was benign - no cancer. I hope that he healed up well from the biopsy.    Spoke with pt in reference to bx results. Pt voiced understanding.

## 2019-10-30 ENCOUNTER — Other Ambulatory Visit: Payer: Self-pay | Admitting: Specialist

## 2019-10-30 DIAGNOSIS — R0609 Other forms of dyspnea: Secondary | ICD-10-CM

## 2019-11-04 ENCOUNTER — Ambulatory Visit
Admission: RE | Admit: 2019-11-04 | Discharge: 2019-11-04 | Disposition: A | Discharge status: Home / Self Care | Payer: Medicare Other | Source: Ambulatory Visit | Attending: Specialist | Admitting: Specialist

## 2019-11-04 ENCOUNTER — Ambulatory Visit
Admission: RE | Admit: 2019-11-04 | Discharge: 2019-11-04 | Disposition: A | Discharge status: Home / Self Care | Payer: Medicare Other | Source: Home / Self Care | Attending: Specialist | Admitting: Specialist

## 2019-11-04 DIAGNOSIS — R0609 Other forms of dyspnea: Secondary | ICD-10-CM

## 2019-11-04 DIAGNOSIS — R0600 Dyspnea, unspecified: Secondary | ICD-10-CM | POA: Diagnosis present

## 2020-06-10 ENCOUNTER — Other Ambulatory Visit: Payer: Self-pay | Admitting: Neurology

## 2020-06-10 DIAGNOSIS — R202 Paresthesia of skin: Secondary | ICD-10-CM

## 2020-06-14 ENCOUNTER — Ambulatory Visit
Admission: RE | Admit: 2020-06-14 | Discharge: 2020-06-14 | Disposition: A | Discharge status: Home / Self Care | Payer: Medicare Other | Source: Ambulatory Visit | Attending: Neurology | Admitting: Neurology

## 2020-06-14 ENCOUNTER — Other Ambulatory Visit: Payer: Self-pay

## 2020-06-14 DIAGNOSIS — R202 Paresthesia of skin: Secondary | ICD-10-CM | POA: Insufficient documentation

## 2020-06-14 MED ORDER — GADOBUTROL 1 MMOL/ML IV SOLN
10.0000 mL | Freq: Once | INTRAVENOUS | Status: AC | PRN
  Administered 2020-06-14: 10 mL via INTRAVENOUS

## 2021-09-26 DIAGNOSIS — I5189 Other ill-defined heart diseases: Secondary | ICD-10-CM

## 2021-09-26 HISTORY — DX: Other ill-defined heart diseases: I51.89

## 2021-10-07 ENCOUNTER — Telehealth (HOSPITAL_COMMUNITY): Payer: Self-pay | Admitting: *Deleted

## 2021-10-07 ENCOUNTER — Other Ambulatory Visit: Payer: Self-pay | Admitting: Internal Medicine

## 2021-10-07 DIAGNOSIS — R079 Chest pain, unspecified: Secondary | ICD-10-CM

## 2021-10-07 DIAGNOSIS — I208 Other forms of angina pectoris: Secondary | ICD-10-CM

## 2021-10-07 NOTE — Telephone Encounter (Signed)
Reaching out to patient to offer assistance regarding upcoming cardiac imaging study; pt verbalizes understanding of appt date/time, parking situation and where to check in, pre-test NPO status  and verified current allergies; name and call back number provided for further questions should they arise  Lakeysha Slutsky RN Navigator Cardiac Imaging [facility] [phone] office [phone] cell  Patient to take daily medications two hours prior to his cardiac CT scan. 

## 2021-10-10 ENCOUNTER — Ambulatory Visit: Admission: RE | Admit: 2021-10-10 | Payer: Medicare Other | Source: Ambulatory Visit

## 2021-10-19 ENCOUNTER — Telehealth (HOSPITAL_COMMUNITY): Payer: Self-pay | Admitting: *Deleted

## 2021-10-19 NOTE — Telephone Encounter (Signed)
Patient returning call regarding upcoming cardiac imaging study; pt verbalizes understanding of appt date/time, parking situation and where to check in, pre-test NPO status, and verified current allergies; name and call back number provided for further questions should they arise  Larey Brick RN Navigator Cardiac Imaging [facility] [phone] office [phone] cell  Patient to take his daily medications.

## 2021-10-19 NOTE — Telephone Encounter (Signed)
Attempted to call patient regarding upcoming cardiac CT appointment. °Left message on voicemail with name and callback number ° °Kaelei Wheeler RN Navigator Cardiac Imaging °[facility] °[phone] Office °[phone] Cell ° °

## 2021-10-20 ENCOUNTER — Ambulatory Visit
Admission: RE | Admit: 2021-10-20 | Discharge: 2021-10-20 | Disposition: A | Discharge status: Home / Self Care | Payer: Medicare Other | Source: Ambulatory Visit | Attending: Internal Medicine | Admitting: Internal Medicine

## 2021-10-20 DIAGNOSIS — R079 Chest pain, unspecified: Secondary | ICD-10-CM | POA: Diagnosis present

## 2021-10-20 DIAGNOSIS — I208 Other forms of angina pectoris: Secondary | ICD-10-CM

## 2021-10-20 DIAGNOSIS — I2510 Atherosclerotic heart disease of native coronary artery without angina pectoris: Secondary | ICD-10-CM

## 2021-10-20 HISTORY — DX: Atherosclerotic heart disease of native coronary artery without angina pectoris: I25.10

## 2021-10-20 MED ORDER — IOHEXOL 350 MG/ML SOLN
125.0000 mL | Freq: Once | INTRAVENOUS | Status: AC | PRN
  Administered 2021-10-20: 125 mL via INTRAVENOUS

## 2021-10-20 MED ORDER — NITROGLYCERIN 0.4 MG SL SUBL
0.8000 mg | SUBLINGUAL_TABLET | Freq: Once | SUBLINGUAL | Status: AC
  Administered 2021-10-20: 0.8 mg via SUBLINGUAL

## 2021-10-20 NOTE — Progress Notes (Signed)
Patient tolerated procedure well. Ambulate w/o difficulty. Denies light headedness or being dizzy. Sitting in chair drinking water provided. Encouraged to drink extra water today and reasoning explained. Verbalized understanding. All questions answered. ABC intact. No further needs. Discharge from procedure area w/o issues.   °

## 2021-10-24 ENCOUNTER — Other Ambulatory Visit: Payer: Self-pay | Admitting: Orthopedic Surgery

## 2021-10-24 DIAGNOSIS — M75102 Unspecified rotator cuff tear or rupture of left shoulder, not specified as traumatic: Secondary | ICD-10-CM

## 2021-10-31 ENCOUNTER — Other Ambulatory Visit
Admission: RE | Admit: 2021-10-31 | Discharge: 2021-10-31 | Disposition: A | Discharge status: Home / Self Care | Payer: Medicare Other | Source: Ambulatory Visit | Attending: Internal Medicine | Admitting: Internal Medicine

## 2021-10-31 DIAGNOSIS — R0602 Shortness of breath: Secondary | ICD-10-CM | POA: Insufficient documentation

## 2021-10-31 DIAGNOSIS — Z79899 Other long term (current) drug therapy: Secondary | ICD-10-CM | POA: Diagnosis present

## 2021-10-31 DIAGNOSIS — Z01818 Encounter for other preprocedural examination: Secondary | ICD-10-CM | POA: Insufficient documentation

## 2021-10-31 LAB — BRAIN NATRIURETIC PEPTIDE: B Natriuretic Peptide: 17.3 pg/mL (ref 0.0–100.0)

## 2021-11-01 ENCOUNTER — Ambulatory Visit
Admission: RE | Admit: 2021-11-01 | Discharge: 2021-11-01 | Disposition: A | Discharge status: Home / Self Care | Payer: Medicare Other | Source: Ambulatory Visit | Attending: Orthopedic Surgery | Admitting: Orthopedic Surgery

## 2021-11-01 DIAGNOSIS — M75102 Unspecified rotator cuff tear or rupture of left shoulder, not specified as traumatic: Secondary | ICD-10-CM | POA: Insufficient documentation

## 2021-11-22 ENCOUNTER — Ambulatory Visit
Admission: RE | Admit: 2021-11-22 | Discharge: 2021-11-22 | Disposition: A | Discharge status: Home / Self Care | Payer: Medicare Other | Attending: Internal Medicine | Admitting: Internal Medicine

## 2021-11-22 ENCOUNTER — Encounter
Admission: RE | Disposition: A | Discharge status: Home / Self Care | Payer: Self-pay | Source: Home / Self Care | Attending: Internal Medicine

## 2021-11-22 ENCOUNTER — Encounter: Payer: Self-pay | Admitting: Internal Medicine

## 2021-11-22 ENCOUNTER — Other Ambulatory Visit: Payer: Self-pay

## 2021-11-22 DIAGNOSIS — R9439 Abnormal result of other cardiovascular function study: Secondary | ICD-10-CM | POA: Diagnosis present

## 2021-11-22 DIAGNOSIS — E119 Type 2 diabetes mellitus without complications: Secondary | ICD-10-CM | POA: Insufficient documentation

## 2021-11-22 DIAGNOSIS — I272 Pulmonary hypertension, unspecified: Secondary | ICD-10-CM

## 2021-11-22 HISTORY — DX: Pulmonary hypertension, unspecified: I27.20

## 2021-11-22 HISTORY — DX: Other hereditary ataxias: G11.8

## 2021-11-22 HISTORY — PX: RIGHT/LEFT HEART CATH AND CORONARY ANGIOGRAPHY: CATH118266

## 2021-11-22 HISTORY — DX: Essential (primary) hypertension: I10

## 2021-11-22 HISTORY — DX: Hyperlipidemia, unspecified: E78.5

## 2021-11-22 LAB — GLUCOSE, CAPILLARY
Glucose-Capillary: 101 mg/dL — ABNORMAL HIGH (ref 70–99)
Glucose-Capillary: 100 mg/dL — ABNORMAL HIGH (ref 70–99)

## 2021-11-22 SURGERY — RIGHT/LEFT HEART CATH AND CORONARY ANGIOGRAPHY
Anesthesia: Moderate Sedation | Laterality: Bilateral

## 2021-11-22 MED ORDER — FUROSEMIDE 10 MG/ML IJ SOLN
INTRAMUSCULAR | Status: DC | PRN
  Administered 2021-11-22: 40 mg via INTRAVENOUS

## 2021-11-22 MED ORDER — ASPIRIN 81 MG PO CHEW
81.0000 mg | CHEWABLE_TABLET | ORAL | Status: AC
  Administered 2021-11-22: 81 mg via ORAL

## 2021-11-22 MED ORDER — LIDOCAINE HCL (PF) 1 % IJ SOLN
INTRAMUSCULAR | Status: DC | PRN
  Administered 2021-11-22 (×2): 2 mL

## 2021-11-22 MED ORDER — SODIUM CHLORIDE 0.9 % WEIGHT BASED INFUSION
1.0000 mL/kg/h | INTRAVENOUS | Status: DC
  Administered 2021-11-22: 10 mL/h via INTRAVENOUS

## 2021-11-22 MED ORDER — ACETAMINOPHEN 325 MG PO TABS: 650.0000 mg | ORAL_TABLET | ORAL | Status: DC | PRN

## 2021-11-22 MED ORDER — SODIUM CHLORIDE 0.9 % IV SOLN: 250.0000 mL | INTRAVENOUS | Status: DC | PRN

## 2021-11-22 MED ORDER — HEPARIN SODIUM (PORCINE) 1000 UNIT/ML IJ SOLN
INTRAMUSCULAR | Status: DC | PRN
  Administered 2021-11-22: 5000 [IU] via INTRAVENOUS

## 2021-11-22 MED ORDER — SODIUM CHLORIDE 0.9% FLUSH: 3.0000 mL | INTRAVENOUS | Status: DC | PRN

## 2021-11-22 MED ORDER — HEPARIN SODIUM (PORCINE) 1000 UNIT/ML IJ SOLN
INTRAMUSCULAR | Status: AC
  Filled 2021-11-22: qty 10

## 2021-11-22 MED ORDER — SODIUM CHLORIDE 0.9% FLUSH: 3.0000 mL | Freq: Two times a day (BID) | INTRAVENOUS | Status: DC

## 2021-11-22 MED ORDER — MIDAZOLAM HCL 2 MG/2ML IJ SOLN
INTRAMUSCULAR | Status: DC | PRN
  Administered 2021-11-22: 1 mg via INTRAVENOUS

## 2021-11-22 MED ORDER — LABETALOL HCL 5 MG/ML IV SOLN: 10.0000 mg | INTRAVENOUS | Status: DC | PRN

## 2021-11-22 MED ORDER — LIDOCAINE HCL 1 % IJ SOLN
INTRAMUSCULAR | Status: AC
  Filled 2021-11-22: qty 20

## 2021-11-22 MED ORDER — FENTANYL CITRATE (PF) 100 MCG/2ML IJ SOLN
INTRAMUSCULAR | Status: DC | PRN
  Administered 2021-11-22: 25 ug via INTRAVENOUS

## 2021-11-22 MED ORDER — SODIUM CHLORIDE 0.9 % WEIGHT BASED INFUSION
3.0000 mL/kg/h | INTRAVENOUS | Status: AC
  Administered 2021-11-22: 3 mL/kg/h via INTRAVENOUS

## 2021-11-22 MED ORDER — ONDANSETRON HCL 4 MG/2ML IJ SOLN: 4.0000 mg | Freq: Four times a day (QID) | INTRAMUSCULAR | Status: DC | PRN

## 2021-11-22 MED ORDER — IOHEXOL 300 MG/ML  SOLN
INTRAMUSCULAR | Status: DC | PRN
  Administered 2021-11-22: 50 mL

## 2021-11-22 MED ORDER — ASPIRIN 81 MG PO CHEW
CHEWABLE_TABLET | ORAL | Status: AC
  Filled 2021-11-22: qty 1

## 2021-11-22 MED ORDER — HYDRALAZINE HCL 20 MG/ML IJ SOLN: 10.0000 mg | INTRAMUSCULAR | Status: DC | PRN

## 2021-11-22 MED ORDER — HEPARIN (PORCINE) IN NACL 1000-0.9 UT/500ML-% IV SOLN
INTRAVENOUS | Status: DC | PRN
  Administered 2021-11-22 (×2): 500 mL

## 2021-11-22 MED ORDER — FUROSEMIDE 10 MG/ML IJ SOLN
INTRAMUSCULAR | Status: AC
  Filled 2021-11-22: qty 4

## 2021-11-22 MED ORDER — HEPARIN (PORCINE) IN NACL 1000-0.9 UT/500ML-% IV SOLN
INTRAVENOUS | Status: AC
  Filled 2021-11-22: qty 1000

## 2021-11-22 MED ORDER — FENTANYL CITRATE (PF) 100 MCG/2ML IJ SOLN
INTRAMUSCULAR | Status: AC
  Filled 2021-11-22: qty 2

## 2021-11-22 MED ORDER — VERAPAMIL HCL 2.5 MG/ML IV SOLN
INTRAVENOUS | Status: AC
  Filled 2021-11-22: qty 2

## 2021-11-22 MED ORDER — VERAPAMIL HCL 2.5 MG/ML IV SOLN
INTRAVENOUS | Status: DC | PRN
  Administered 2021-11-22: 2.5 mg via INTRA_ARTERIAL

## 2021-11-22 MED ORDER — MIDAZOLAM HCL 2 MG/2ML IJ SOLN
INTRAMUSCULAR | Status: AC
  Filled 2021-11-22: qty 2

## 2021-11-22 SURGICAL SUPPLY — 13 items
BAND ZEPHYR COMPRESS 30 LONG (HEMOSTASIS) IMPLANT
CATH 5FR JL3.5 JR4 ANG PIG MP (CATHETERS) IMPLANT
CATH BALLN WEDGE 5F 110CM (CATHETERS) IMPLANT
DRAPE BRACHIAL (DRAPES) IMPLANT
GLIDESHEATH SLEND SS 6F .021 (SHEATH) IMPLANT
GUIDEWIRE INQWIRE 1.5J.035X260 (WIRE) IMPLANT
INQWIRE 1.5J .035X260CM (WIRE) ×1
KIT SYRINGE INJ CVI SPIKEX1 (MISCELLANEOUS) IMPLANT
PACK CARDIAC CATH (CUSTOM PROCEDURE TRAY) ×1 IMPLANT
PROTECTION STATION PRESSURIZED (MISCELLANEOUS) ×1
SET ATX SIMPLICITY (MISCELLANEOUS) IMPLANT
SHEATH GLIDE SLENDER 4/5FR (SHEATH) IMPLANT
STATION PROTECTION PRESSURIZED (MISCELLANEOUS) IMPLANT

## 2021-11-22 NOTE — Progress Notes (Signed)
Spoke with MD Callwood. MD informed that past EKG was on 10/06/2021. MD reported patient does not need an updated EKG.

## 2021-11-25 ENCOUNTER — Encounter: Payer: Self-pay | Admitting: Internal Medicine

## 2021-11-25 LAB — CARDIAC CATHETERIZATION: Cath EF Quantitative: 55 %

## 2022-04-02 IMAGING — MR MR HEAD WO/W CM
14 series · 48 of 48 positions shown · IV contrast (gadavist)
Comparison: None.

CLINICAL DATA: Left finger numbness. Hands falling asleep.
Peripheral neuropathy.

EXAM:
MRI HEAD WITHOUT AND WITH CONTRAST
TECHNIQUE: Multiplanar, multiecho pulse sequences of the brain and surrounding
structures were obtained without and with intravenous contrast.
CONTRAST:  10mL GADAVIST GADOBUTROL 1 MMOL/ML IV SOLN

[Series 5: ax dwi_tracew · axial · 3.0mm · 0.65mm/px · z∈[-79,+75]mm · 4 of 48 slices shown]
[im 1/48]
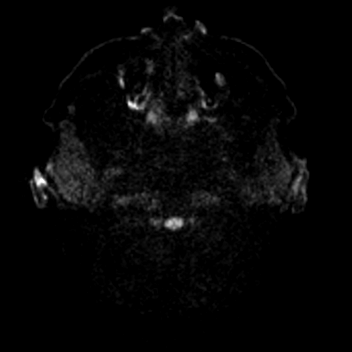
[im 16/48]
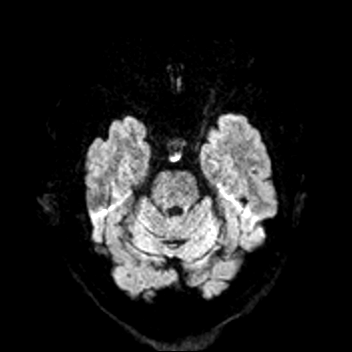
[im 32/48]
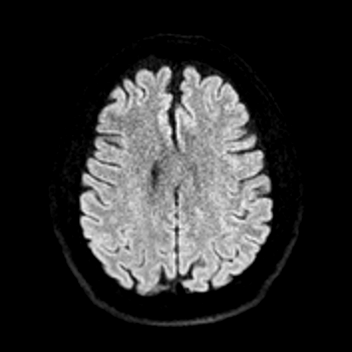
[im 48/48]
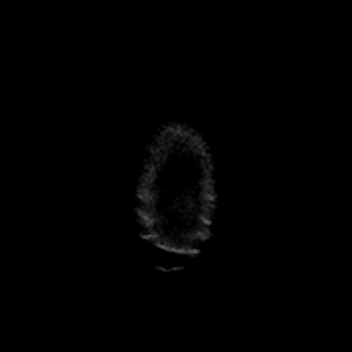

[Series 6: ax dwi_adc · axial · 3.0mm · 0.65mm/px · z∈[-79,+75]mm · 4 of 48 slices shown]
[im 1/48]
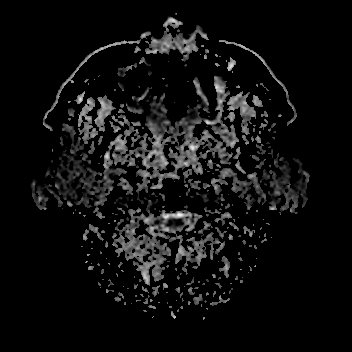
[im 16/48]
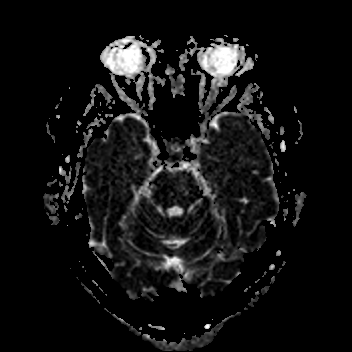
[im 32/48]
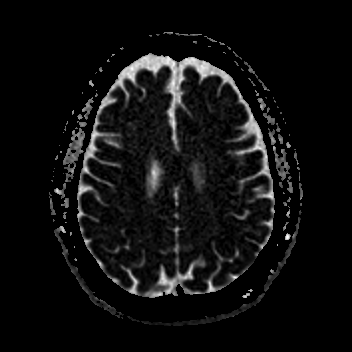
[im 48/48]
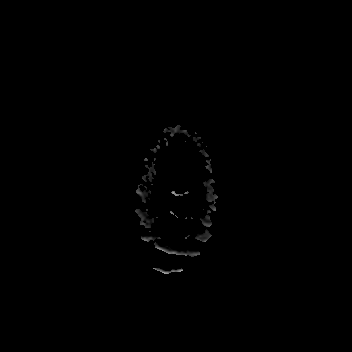

[Series 7: T1 · sagittal · 5.0mm · 0.62mm/px · 1 of 25 slices shown (1 of 2)]
[im 1/25]
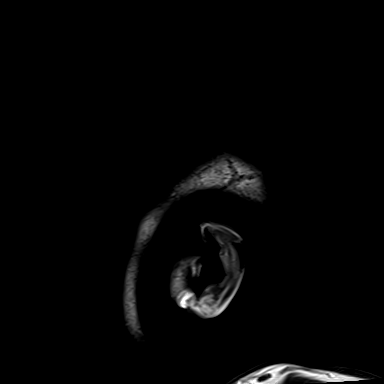

[Series 8: T2 · axial · 5.0mm · 0.53mm/px · 1 of 25 slices shown]
[im 1/25]
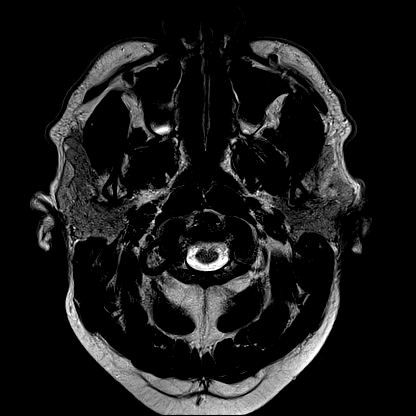

[Series 9: FLAIR · axial · 3.0mm · 0.53mm/px · z∈[-82,+78]mm · 3 of 55 slices shown (1 of 2)]
[im 1/55]
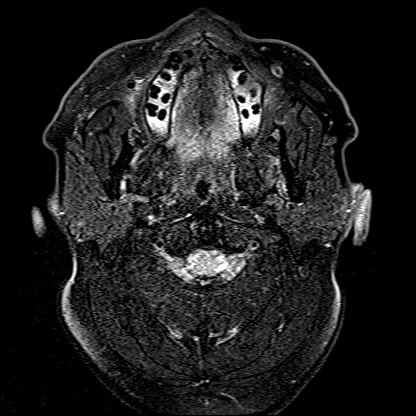
[im 28/55]
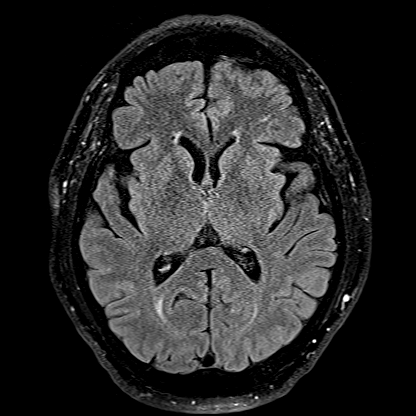
[im 55/55]
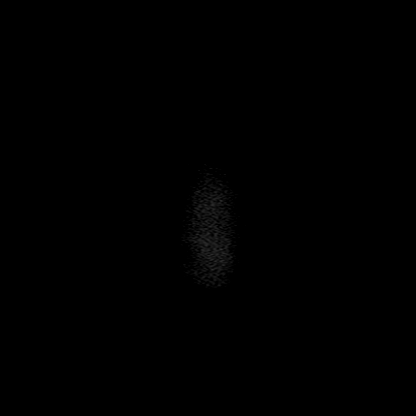

[Series 14: cor dwi_tracew · coronal · 5.0mm · 0.60mm/px · 2 of 38 slices shown]
[im 1/38]
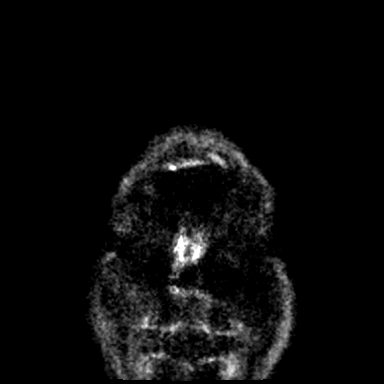
[im 38/38]
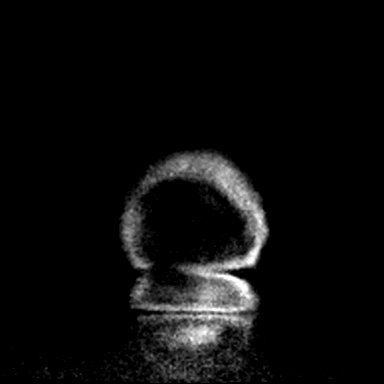

[Series 15: cor dwi_adc · coronal · 5.0mm · 0.60mm/px · 2 of 38 slices shown]
[im 1/38]
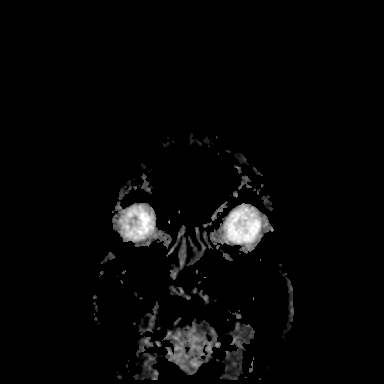
[im 38/38]
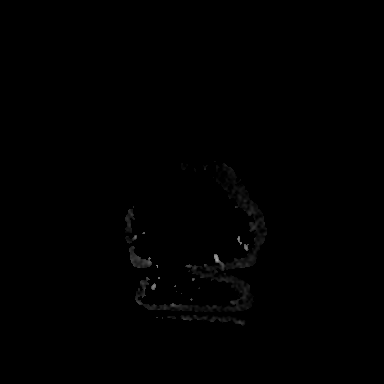

[Series 16: T1 · axial · 1.0mm · 0.98mm/px · z∈[-87,+86]mm · 10 of 176 slices shown (2 of 2)]
[im 1/176]
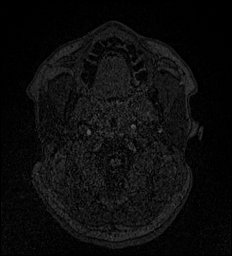
[im 20/176]
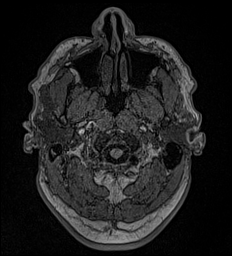
[im 39/176]
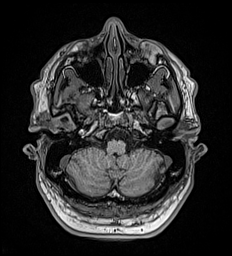
[im 59/176]
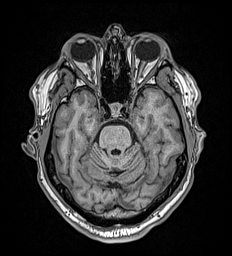
[im 78/176]
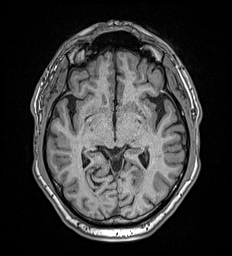
[im 98/176]
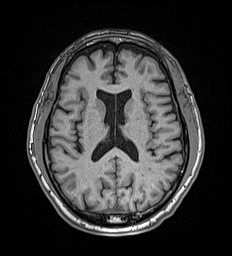
[im 117/176]
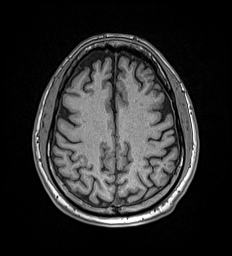
[im 137/176]
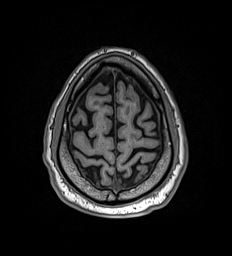
[im 156/176]
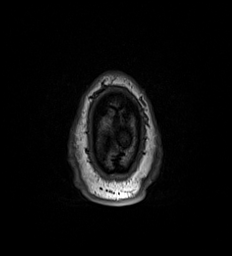
[im 176/176]
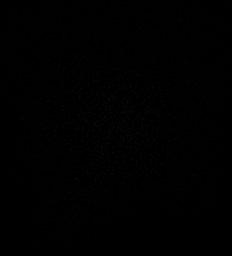

[Series 17: FLAIR · sagittal · 5.0mm · 0.94mm/px · 1 of 25 slices shown (2 of 2)]
[im 1/25]
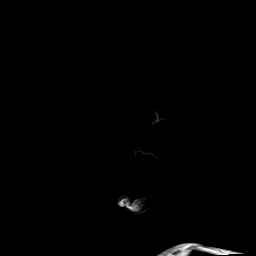

[Series 19: pha_images · axial · 3.0mm · 0.90mm/px · z∈[-89,+86]mm · 3 of 60 slices shown]
[im 1/60]
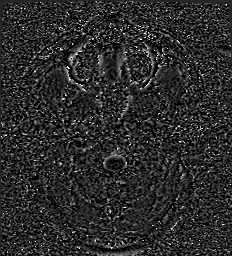
[im 30/60]
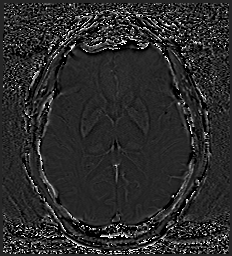
[im 60/60]
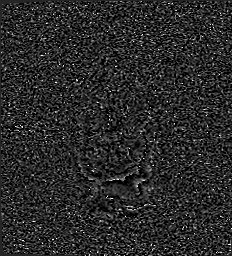

[Series 20: swi_images · axial · 3.0mm · 0.90mm/px · z∈[-89,+86]mm · 3 of 60 slices shown]
[im 1/60]
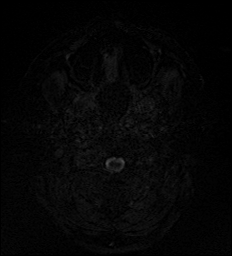
[im 30/60]
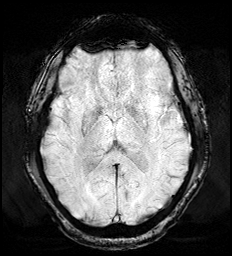
[im 60/60]
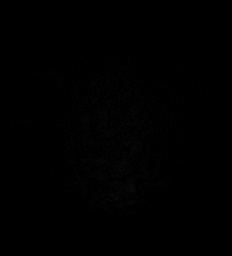

[Series 22: T2 post-contrast · coronal · 5.0mm · 0.57mm/px · 2 of 29 slices shown]
[im 1/29]
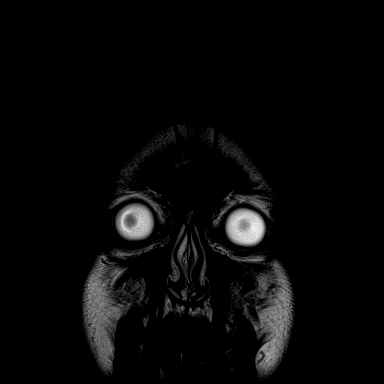
[im 29/29]
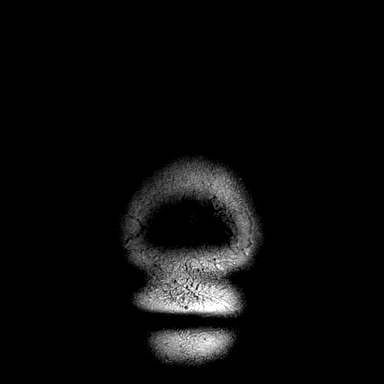

[Series 23: T1 post-contrast · axial · 1.0mm · 0.98mm/px · z∈[-87,+86]mm · 10 of 176 slices shown (1 of 2)]
[im 1/176]
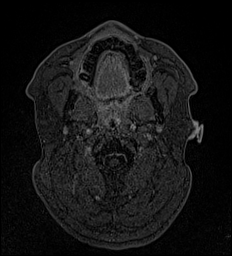
[im 20/176]
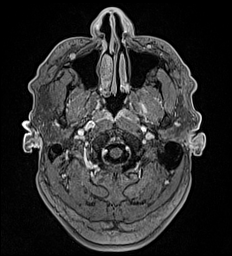
[im 39/176]
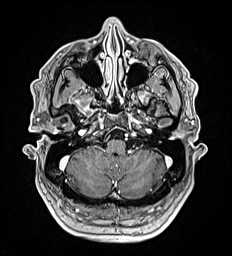
[im 59/176]
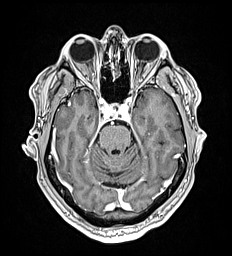
[im 78/176]
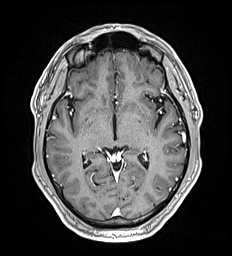
[im 98/176]
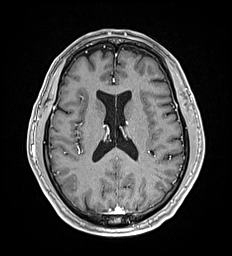
[im 117/176]
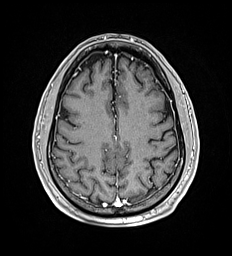
[im 137/176]
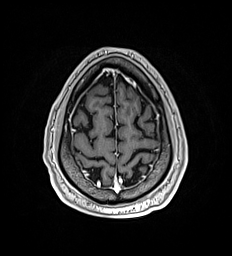
[im 156/176]
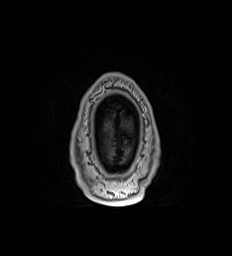
[im 176/176]
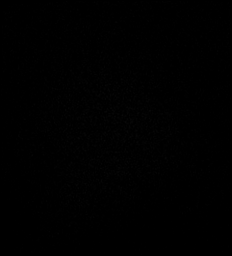

[Series 24: T1 post-contrast · coronal · 5.0mm · 0.57mm/px · 2 of 29 slices shown (2 of 2)]
[im 1/29]
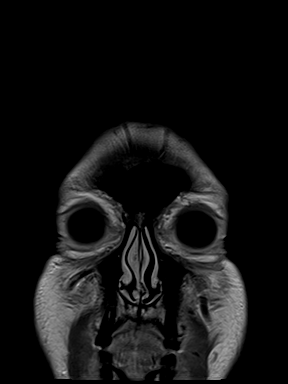
[im 29/29]
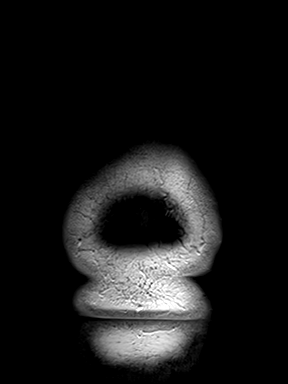

[48 of 48 positions shown; findings below may reference images not displayed]

FINDINGS: Brain: No acute infarction, hemorrhage, hydrocephalus, extra-axial
collection or mass lesion. 5 or 6 small FLAIR hyperintensities in
the cerebral white matter from nonspecific remote insult, borderline
expected for age. Normal brain volume.

Vascular: Normal flow voids

Skull and upper cervical spine: Normal marrow signal

Sinuses/Orbits: Negative
IMPRESSION: 1. Rare remote white matter insults with nonspecific pattern. No
specific demyelinating pattern.
2. No recent insult or abnormal enhancement.

## 2022-10-09 ENCOUNTER — Other Ambulatory Visit: Payer: Self-pay | Admitting: Orthopedic Surgery

## 2022-10-16 ENCOUNTER — Encounter
Admission: RE | Admit: 2022-10-16 | Discharge: 2022-10-16 | Disposition: A | Discharge status: Home / Self Care | Payer: Medicare Other | Source: Ambulatory Visit | Attending: Orthopedic Surgery | Admitting: Orthopedic Surgery

## 2022-10-16 ENCOUNTER — Other Ambulatory Visit: Payer: Self-pay

## 2022-10-16 VITALS — Ht 65.0 in | Wt 250.0 lb

## 2022-10-16 DIAGNOSIS — Z01812 Encounter for preprocedural laboratory examination: Secondary | ICD-10-CM

## 2022-10-16 DIAGNOSIS — E119 Type 2 diabetes mellitus without complications: Secondary | ICD-10-CM

## 2022-10-16 HISTORY — DX: Depression, unspecified: F32.A

## 2022-10-16 HISTORY — DX: Dyspnea, unspecified: R06.00

## 2022-10-16 HISTORY — DX: Type 2 diabetes mellitus with diabetic polyneuropathy: E11.42

## 2022-10-16 HISTORY — DX: Unspecified convulsions: R56.9

## 2022-10-16 HISTORY — DX: Unspecified osteoarthritis, unspecified site: M19.90

## 2022-10-16 HISTORY — DX: Chest pain, unspecified: R07.9

## 2022-10-16 HISTORY — DX: Chronic obstructive pulmonary disease, unspecified: J44.9

## 2022-10-16 HISTORY — DX: Unspecified asthma, uncomplicated: J45.909

## 2022-10-16 HISTORY — DX: Type 2 diabetes mellitus without complications: E11.9

## 2022-10-16 HISTORY — DX: Angina pectoris, unspecified: I20.9

## 2022-10-16 NOTE — Patient Instructions (Addendum)
Your procedure is scheduled on: Tuesday August 20  Report to the Registration Desk on the 1st floor of the [facility] To find out your arrival time, please call [phone] between 1PM - 3PM on: Monday August 19  If your arrival time is 6:00 am, do not arrive before that time as the [facility] entrance doors do not open until 6:00 am.  REMEMBER: Instructions that are not followed completely may result in serious medical risk, up to and including death; or upon the discretion of your surgeon and anesthesiologist your surgery may need to be rescheduled.  Do not eat food after midnight the night before surgery.  No gum chewing or hard candies.  You may however, drink WATER up to 2 hours before you are scheduled to arrive for your surgery. Do not drink anything within 2 hours of your scheduled arrival time.   In addition, your doctor has ordered for you to drink the provided:  Gatorade G2 Drinking this carbohydrate drink up to two hours before surgery helps to reduce insulin resistance and improve patient outcomes. Please complete drinking 2 hours before scheduled arrival time.  One week prior to surgery: Stop Anti-inflammatories (NSAIDS) such as Advil, Aleve, Ibuprofen, Motrin, Naproxen, Naprosyn and Aspirin based products such as Excedrin, Goody's Powder, BC Powder.   Stop ANY OVER THE COUNTER supplements until after surgery. You may however, continue to take Tylenol if needed for pain up until the day of surgery.  Continue taking all prescribed medications with the exception of the following: hydrochlorothiazide (HYDRODIURIL)  old the day of surgery, last dose Monday August 19  lisinopril (PRINIVIL,ZESTRIL)  hold the day of surgery , last dose Monday August 19  metFORMIN (GLUCOPHAGE) hold 2 days prior to surgery, last dose Saturday August 17   TAKE ONLY THESE MEDICATIONS THE MORNING OF SURGERY WITH A SIP OF WATER:  albuterol (VENTOLIN HFA)  atenolol (TENORMIN  cloNIDine  (CATAPRES)  FLUoxetine (PROZAC)  Budeson-Glycopyrrol-Formoterol (BREZTRI AEROSPHERE)   Use inhalers on the day of surgery and bring to the hospital.  No Alcohol for 24 hours before or after surgery.  No Smoking including e-cigarettes for 24 hours before surgery.  No chewable tobacco products for at least 6 hours before surgery.  No nicotine patches on the day of surgery.  Do not use any "recreational" drugs for at least a week (preferably 2 weeks) before your surgery.  Please be advised that the combination of cocaine and anesthesia may have negative outcomes, up to and including death. If you test positive for cocaine, your surgery will be cancelled.  On the morning of surgery brush your teeth with toothpaste and water, you may rinse your mouth with mouthwash if you wish. Do not swallow any toothpaste or mouthwash.  Use CHG Soap as directed on instruction sheet.  Do not wear jewelry, make-up, hairpins, clips or nail polish.  Do not wear lotions, powders, or perfumes.   Do not shave body hair from the neck down 48 hours before surgery.  Contact lenses, hearing aids and dentures may not be worn into surgery.  Do not bring valuables to the hospital. [facility] is not responsible for any missing/lost belongings or valuables.   Notify your doctor if there is any change in your medical condition (cold, fever, infection).  Wear comfortable clothing (specific to your surgery type) to the hospital.  After surgery, you can help prevent lung complications by doing breathing exercises.  Take deep breaths and cough every 1-2 hours. Your doctor  may order a device called an Incentive Spirometer to help you take deep breaths.  If you are being discharged the day of surgery, you will not be allowed to drive home. You will need a responsible individual to drive you home and stay with you for 24 hours after surgery.   If you are taking public transportation, you will need to have a  responsible individual with you.  Please call the Pre-admissions Testing Dept. at [phone] if you have any questions about these instructions.  Surgery Visitation Policy:  Patients having surgery or a procedure may have two visitors.  Children under the age of 32 must have an adult with them who is not the patient.             Preparing for Surgery with CHLORHEXIDINE GLUCONATE (CHG) Soap  Chlorhexidine Gluconate (CHG) Soap  o An antiseptic cleaner that kills germs and bonds with the skin to continue killing germs even after washing  o Used for showering the night before surgery and morning of surgery  Before surgery, you can play an important role by reducing the number of germs on your skin.  CHG (Chlorhexidine gluconate) soap is an antiseptic cleanser which kills germs and bonds with the skin to continue killing germs even after washing.  Please do not use if you have an allergy to CHG or antibacterial soaps. If your skin becomes reddened/irritated stop using the CHG.  1. Shower the NIGHT BEFORE SURGERY and the MORNING OF SURGERY with CHG soap.  2. If you choose to wash your hair, wash your hair first as usual with your normal shampoo.  3. After shampooing, rinse your hair and body thoroughly to remove the shampoo.  4. Use CHG as you would any other liquid soap. You can apply CHG directly to the skin and wash gently with a scrungie or a clean washcloth.  5. Apply the CHG soap to your body only from the neck down. Do not use on open wounds or open sores. Avoid contact with your eyes, ears, mouth, and genitals (private parts). Wash face and genitals (private parts) with your normal soap.  6. Wash thoroughly, paying special attention to the area where your surgery will be performed.  7. Thoroughly rinse your body with warm water.  8. Do not shower/wash with your normal soap after using and rinsing off the CHG soap.  9. Pat yourself dry with a clean towel.  10.  Wear clean pajamas to bed the night before surgery.             How to Use an Incentive Spirometer An incentive spirometer is a tool that measures how well you are filling your lungs with each breath. Learning to take long, deep breaths using this tool can help you keep your lungs clear and active. This may help to reverse or lessen your chance of developing breathing (pulmonary) problems, especially infection. You may be asked to use a spirometer: After a surgery. If you have a lung problem or a history of smoking. After a long period of time when you have been unable to move or be active. If the spirometer includes an indicator to show the highest number that you have reached, your health care provider or respiratory therapist will help you set a goal. Keep a log of your progress as told by your health care provider. What are the risks? Breathing too quickly may cause dizziness or cause you to pass out. Take your time so you do  not get dizzy or light-headed. If you are in pain, you may need to take pain medicine before doing incentive spirometry. It is harder to take a deep breath if you are having pain. How to use your incentive spirometer  Sit up on the edge of your bed or on a chair. Hold the incentive spirometer so that it is in an upright position. Before you use the spirometer, breathe out normally. Place the mouthpiece in your mouth. Make sure your lips are closed tightly around it. Breathe in slowly and as deeply as you can through your mouth, causing the piston or the ball to rise toward the top of the chamber. Hold your breath for 3-5 seconds, or for as long as possible. If the spirometer includes a coach indicator, use this to guide you in breathing. Slow down your breathing if the indicator goes above the marked areas. Remove the mouthpiece from your mouth and breathe out normally. The piston or ball will return to the bottom of the chamber. Rest for a few seconds, then  repeat the steps 10 or more times. Take your time and take a few normal breaths between deep breaths so that you do not get dizzy or light-headed. Do this every 1-2 hours when you are awake. If the spirometer includes a goal marker to show the highest number you have reached (best effort), use this as a goal to work toward during each repetition. After each set of 10 deep breaths, cough a few times. This will help to make sure that your lungs are clear. If you have an incision on your chest or abdomen from surgery, place a pillow or a rolled-up towel firmly against the incision when you cough. This can help to reduce pain while taking deep breaths and coughing. General tips When you are able to get out of bed: Walk around often. Continue to take deep breaths and cough in order to clear your lungs. Keep using the incentive spirometer until your health care provider says it is okay to stop using it. If you have been in the hospital, you may be told to keep using the spirometer at home. Contact a health care provider if: You are having difficulty using the spirometer. You have trouble using the spirometer as often as instructed. Your pain medicine is not giving enough relief for you to use the spirometer as told. You have a fever. Get help right away if: You develop shortness of breath. You develop a cough with bloody mucus from the lungs. You have fluid or blood coming from an incision site after you cough. Summary An incentive spirometer is a tool that can help you learn to take long, deep breaths to keep your lungs clear and active. You may be asked to use a spirometer after a surgery, if you have a lung problem or a history of smoking, or if you have been inactive for a long period of time. Use your incentive spirometer as instructed every 1-2 hours while you are awake. If you have an incision on your chest or abdomen, place a pillow or a rolled-up towel firmly against your incision when you  cough. This will help to reduce pain. Get help right away if you have shortness of breath, you cough up bloody mucus, or blood comes from your incision when you cough. This information is not intended to replace advice given to you by your health care provider. Make sure you discuss any questions you have with your health care provider.  Document Revised: 05/12/2019 Document Reviewed: 05/12/2019 [employer] Patient Education  2024 [employer]   12. Place clean sheets on your bed the night of your first shower and do not sleep with pets.  13. Shower again with the CHG soap on the day of surgery prior to arriving at the hospital.  14. Do not apply any deodorants/lotions/powders.  15. Please wear clean clothes to the hospital.

## 2022-10-17 ENCOUNTER — Encounter
Admission: RE | Admit: 2022-10-17 | Discharge: 2022-10-17 | Disposition: A | Discharge status: Home / Self Care | Payer: Medicare Other | Source: Ambulatory Visit | Attending: Orthopedic Surgery | Admitting: Orthopedic Surgery

## 2022-10-17 DIAGNOSIS — Z01818 Encounter for other preprocedural examination: Secondary | ICD-10-CM | POA: Diagnosis present

## 2022-10-17 DIAGNOSIS — Z01810 Encounter for preprocedural cardiovascular examination: Secondary | ICD-10-CM | POA: Insufficient documentation

## 2022-10-17 DIAGNOSIS — Z01812 Encounter for preprocedural laboratory examination: Secondary | ICD-10-CM | POA: Insufficient documentation

## 2022-10-17 DIAGNOSIS — E119 Type 2 diabetes mellitus without complications: Secondary | ICD-10-CM | POA: Insufficient documentation

## 2022-10-17 LAB — CBC
HCT: 41.3 % (ref 39.0–52.0)
Hemoglobin: 13.3 g/dL (ref 13.0–17.0)
MCH: 28.5 pg (ref 26.0–34.0)
MCHC: 32.2 g/dL (ref 30.0–36.0)
MCV: 88.6 fL (ref 80.0–100.0)
Platelets: 217 10*3/uL (ref 150–400)
RBC: 4.66 MIL/uL (ref 4.22–5.81)
RDW: 12.7 % (ref 11.5–15.5)
WBC: 6.3 10*3/uL (ref 4.0–10.5)
nRBC: 0 % (ref 0.0–0.2)

## 2022-10-19 ENCOUNTER — Encounter: Payer: Self-pay | Admitting: Orthopedic Surgery

## 2022-10-19 NOTE — Progress Notes (Signed)
Perioperative / Anesthesia Services  Pre-Admission Testing Clinical Review / Preoperative Anesthesia Consult  Date: 10/20/22  Patient Demographics:  Name: Ziere Kuti DOB:   [birth year] MRN:   [id number]  Planned Surgical Procedure(s):    Case: [id number] Date/Time: 10/24/22 0715   Procedure: Left shoulder arthroscopic rotator cuff repair, subacromial decompression, distal clavicle excision, and biceps tenodesis (Left: Shoulder)   Anesthesia type: Choice   Pre-op diagnosis: Tear of left supraspinatus tendon M75.102   Location: [facility] OR ROOM 01 / [facility]   Surgeons: Signa Kell, MD     NOTE: Available PAT nursing documentation and vital signs have been reviewed. Clinical nursing staff has updated patient's PMH/PSHx, current medication list, and drug allergies/intolerances to ensure comprehensive history available to assist in medical decision making as it pertains to the aforementioned surgical procedure and anticipated anesthetic course. Extensive review of available clinical information personally performed. [place] [facility] and P[facility] updated with any diagnoses/procedures that  may have been inadvertently omitted during his intake with the pre-admission testing department's nursing staff.  Clinical Discussion:  Harlon Willetts is a 57 y.o. male who is submitted for pre-surgical anesthesia review and clearance prior to him undergoing the above procedure. Patient has never been a smoker. Pertinent PMH includes: CAD, diastolic dysfunction, angina, HTN, HLD, T2DM, COPD, asthma, pulmonary hypertension, OSAH (requires nocturnal PAP therapy), seizures, OA, tear of LEFT supraspinatus tendon, depression, insomnia.  Patient is followed by cardiology Juliann Pares, MD). He was last seen in the cardiology clinic on 06/14/2022; notes reviewed. At the time of his clinic visit, patient doing well overall from a cardiovascular perspective.  Patient with chronic exertional dyspnea related  to his underlying COPD and asthma overlap. Breathing reported to be stable overall and at baseline. He is jointly managed by pulmonary medicine Meredeth Ide, MD). Patient denied any chest pain, PND, orthopnea, palpitations, significant peripheral edema, weakness, fatigue, vertiginous symptoms, or presyncope/syncope. Patient with a past medical history significant for cardiovascular diagnoses. Documented physical exam was grossly benign, providing no evidence of acute exacerbation and/or decompensation of the patient's known cardiovascular conditions.  Myocardial perfusion imaging study was performed on 10/19/2021 revealing a normal left ventricular systolic function with an EF of 58%.  There were no regional wall motion abnormalities.  SPECT images demonstrated a small mixed perfusion abnormality of mild intensity in the inferior basal lateral region on stress images.  There was borderline redistribution.  No clear evidence of ischemia.  Study determined to be intermediate risk.  TTE performed on 10/19/2021 revealed a normal left ventricular systolic function with an EF of >55%.  There were no regional wall motion abnormalities. Left ventricular diastolic Doppler parameters consistent with abnormal relaxation (G1DD).  There was trivial to mild paravalvular regurgitation.  All transvalvular gradients were noted to be normal providing no evidence suggestive of stenosis.  RVSP 33.0 mmHg.  Aorta normal in size with no evidence of aneurysmal dilatation.  Coronary CTA was performed on 10/20/2021 that demonstrated an Agatston coronary artery calcium score of 151. This placed patient in the 86th percentile for age, sex, and race matched controls. Calcium depositions noted in the mid LAD (25%), mid RCA (<25%) and proximal LCx (<25%) distributions.   Diagnostic RIGHT/LEFT heart catheterization was performed on 11/22/2021 revealing normal left ventricular systolic function with an EF of 55 to 65%.  LVEDP was mildly  elevated.  Coronary anatomy normal with no evidence of significant coronary artery disease.  Hemodynamics: mean PA = 38 mmHg, mean PCWP 31 mmHg, RVEDP = 28  mmHg, mean RA = 30 mmHg, AO saturation, 99%, PA saturation = 78%, and CO = 7.2 L/min.  Findings consistent with moderate pulmonary hypertension.  Blood pressure well controlled at 124/82 mmHg on currently prescribed beta-blocker (atenolol), CCB (amlodipine), alpha-blocker (clonidine), diuretic (HCTZ), and ACEi (lisinopril) therapies.  Patient is on atorvastatin for his HLD diagnosis and ASCVD prevention. T2DM well controlled on currently prescribed regimen; HgbA1c was 6.2% when checked on 04/11/2022.  Of note, since patient was last seen by cardiology, HgbA1c has been rechecked with further improvement to 6.1% on 10/10/2022.  Patient does have an OSAH diagnosis and is reported to be compliant with prescribed nocturnal PAP therapy. Functional capacity is somewhat limited by patient's DOE and other multiple medical comorbidities. With that said, patient is able to complete all of his ADL/IADLs without cardiovascular limitation.  Per the [facility], patient is able to achieve at least 4 METS of physical activity without experiencing any significant degree of angina/anginal equivalent symptoms. No changes were made to his medication regimen during his visit with cardiology.  Patient scheduled to follow-up with outpatient cardiology in 6 months or sooner if needed.  Dreshun Lowy is scheduled for an elective LEFT SHOULDER ARTHROSCOPIC ROTATOR CUFF REPAIR, SUBACROMIAL DECOMPRESSION, DISTAL CLAVICLE EXCISION, AND BICEPS TENODESIS on 10/24/2022 with Dr. Signa Kell, MD.  Given patient's past medical history significant for cardiovascular and cardiopulmonary diagnoses, presurgical clearances were sought from patient's specialty providers.  Specialty clearances were obtained as follows.  Per pulmonary medicine Meredeth Ide, MD), "this patient is optimized for surgery and may  proceed with surgery with an overall MODERATE risk of significant perioperative cardiopulmonary complications".  Per cardiology Juliann Pares, MD), "this patient is optimized for surgery and may proceed with the planned procedural course with a MODERATE risk of significant perioperative cardiovascular complications".  In review of his medication reconciliation, the patient is not noted to be taking any type of anticoagulation or antiplatelet therapies that would need to be held during his perioperative course.  Patient denies previous perioperative complications with anesthesia in the past.  In review his EMR, there are no records available for review pertaining to past procedural/anesthetic courses within the [facility] system.     10/16/2022    4:17 PM 11/22/2021    3:45 PM 11/22/2021    3:30 PM  Vitals with BMI  Height 5\' 5"     Weight 250 lbs    BMI 41.6    Systolic  114 123  Diastolic  75 80  Pulse  66 66    Providers/Specialists:   NOTE: Primary physician provider listed below. Patient may have been seen by APP or partner within same practice.   PROVIDER ROLE / SPECIALTY LAST Sarina Ser, MD Orthopedics (Surgeon) 10/06/2022  Gracelyn Nurse, MD Primary Care Provider 10/17/2022  Rudean Hitt, MD Cardiology 06/14/2022  Ned Clines, MD Pulmonary Medicine 09/20/2022   Allergies:  Patient has no known allergies.  Current Home Medications:   No current facility-administered medications for this encounter.    albuterol (VENTOLIN HFA) 108 (90 Base) MCG/ACT inhaler   atenolol (TENORMIN) 100 MG tablet   atorvastatin (LIPITOR) 40 MG tablet   Budeson-Glycopyrrol-Formoterol (BREZTRI AEROSPHERE) 160-9-4.8 MCG/ACT AERO   cloNIDine (CATAPRES) 0.1 MG tablet   FLUoxetine (PROZAC) 20 MG capsule   glipiZIDE (GLUCOTROL) 5 MG tablet   glucose blood test strip   hydrochlorothiazide (HYDRODIURIL) 25 MG tablet   lisinopril (PRINIVIL,ZESTRIL) 40 MG tablet   metFORMIN (GLUCOPHAGE)  1000 MG tablet   ONETOUCH DELICA LANCETS FINE  MISC   prazosin (MINIPRESS) 2 MG capsule   traZODone (DESYREL) 50 MG tablet   History:   Past Medical History:  Diagnosis Date   Anginal pain (HCC)    Arthritis    Asthma    CAD (coronary artery disease) 10/20/2021   a.) cCTA 10/20/2021: Ca2+ 151 (86th %ile for age/sex/race matched control), 25% mLAD, <25% mRCA and pLCx; b.) R/LHC 11/22/2021: normal coronaries   Chest pain at rest    Controlled type 2 diabetes mellitus without complication, without long-term current use of insulin (HCC)    COPD (chronic obstructive pulmonary disease) (HCC)    Depression    Diabetic polyneuropathy associated with type 2 diabetes mellitus (HCC)    Diastolic dysfunction 09/26/2021   a.) TTE 09/26/2021: EF >55%, triv-mild panval regurg, RVSP 35.3, G1DD; b.) TTE 10/19/2021: EF >55%, triv-mild panval regurg, RVSP 33, G1DD   Dyspnea    Episodic ataxia (HCC)    Hyperlipidemia    Hypertension    Insomnia    a.) takes trazodone PRN   OSA on CPAP    Pulmonary hypertension (HCC) 11/22/2021   a.) R/LHC 11/22/2021: EF 55%, mPA 38, mPCWP 31, RVSP 28, mRA 30, AO sat 99, PA sat 78, CO 7.2   Seizures (HCC)    Tear of left supraspinatus tendon    Past Surgical History:  Procedure Laterality Date   COLONOSCOPY     HERNIA REPAIR     RIGHT/LEFT HEART CATH AND CORONARY ANGIOGRAPHY Bilateral 11/22/2021   Procedure: RIGHT/LEFT HEART CATH AND CORONARY ANGIOGRAPHY;  Surgeon: Alwyn Pea, MD;  Location: [facility] INVASIVE CV LAB;  Service: Cardiovascular;  Laterality: Bilateral;   URETER SURGERY     Family History  Problem Relation Age of Onset   Prostate cancer Neg Hx    Bladder Cancer Neg Hx    Kidney cancer Neg Hx    Social History   Tobacco Use   Smoking status: Never   Smokeless tobacco: Never  Vaping Use   Vaping status: Never Used  Substance Use Topics   Alcohol use: Yes   Drug use: No    Pertinent Clinical Results:  LABS:   No visits with  results within 3 Day(s) from this visit.  Latest known visit with results is:  Hospital Outpatient Visit on 10/17/2022  Component Date Value Ref Range Status   WBC 10/17/2022 6.3  4.0 - 10.5 K/uL Final   RBC 10/17/2022 4.66  4.22 - 5.81 MIL/uL Final   Hemoglobin 10/17/2022 13.3  13.0 - 17.0 g/dL Final   HCT 16/12/9602 41.3  39.0 - 52.0 % Final   MCV 10/17/2022 88.6  80.0 - 100.0 fL Final   MCH 10/17/2022 28.5  26.0 - 34.0 pg Final   MCHC 10/17/2022 32.2  30.0 - 36.0 g/dL Final   RDW 54/11/8117 12.7  11.5 - 15.5 % Final   Platelets 10/17/2022 217  150 - 400 K/uL Final   nRBC 10/17/2022 0.0  0.0 - 0.2 % Final   Performed at [facility], [address] [place], [place] [address]   Component Ref Range & Units 10/10/2022  Glucose 70 - 110 mg/dL 956 High   Sodium 213 - 145 mmol/L 140  Potassium 3.6 - 5.1 mmol/L 4.3  Chloride 97 - 109 mmol/L 97  Carbon Dioxide (CO2) 22.0 - 32.0 mmol/L 37.6 High   Calcium 8.7 - 10.3 mg/dL 9.3  Urea Nitrogen (BUN) 7 - 25 mg/dL 12  Creatinine 0.7 - 1.3 mg/dL 0.8  Glomerular Filtration  Rate (eGFR) >60 mL/min/1.73sq m 105  BUN/Crea Ratio 6.0 - 20.0 15.0  Anion Gap w/K 6.0 - 16.0 9.7  Resulting Agency [facility]  Specimen Collected: 10/10/22 07:15   Performed by: [facility] Last Resulted: 10/10/22 10:54  Received From: [facility]  Result Received: 10/15/22 13:57   Component Ref Range & Units 10/10/2022  Hemoglobin A1C 4.2 - 5.6 % 6.1 High   Average Blood Glucose (Calc) mg/dL 161  Narrative Performed by [facility] - LAB Normal Range:    4.2 - 5.6% Increased Risk:  5.7 - 6.4% Diabetes:        >= 6.5% Glycemic Control for adults with diabetes:  <7%   Specimen Collected: 10/10/22 07:15   Performed by: [facility] Last Resulted: 10/10/22 09:17  Received From: [facility]  Result Received: 10/15/22 13:57    ECG: Date: 10/17/2022 Time  ECG obtained: 0942 AM Rate: 63 bpm Rhythm: normal sinus Axis (leads I and aVF): Normal Intervals: PR 182 ms. QRS 102 ms. QTc 433 ms. ST segment and T wave changes: No evidence of acute ST segment elevation or depression.   Comparison: Similar to previous tracing obtained on 10/06/2021   IMAGING / PROCEDURES: PULMONARY FUNCTION TESTING performed on 06/21/2022 FVC 2.2 LITERS (53 %) FEV1 1.44 LITERS (51 %)  FEF 51 SPO2 At Rest On Room Air: 94% SPO2 With Exertion On Room Air: 86% SPO2 On Oxygen 2 LPM With Exertion: 98%  Impression: Appears to be more restrictive  RIGHT/LEFT HEART CATHETERIZATION AND CORONARY ANGIOGRAPHY performed on 11/22/2021 Normal left ventricular systolic function with an EF of 55 to 65% Mildly elevated LVEDP Normal coronaries Hemodynamics Mean PA = 38 mmHg Mean PCWP = 31 mmHg RVEDP = 28 mmHg Mean RA = 30 mmHg AO saturation = 99% PA saturation = 78% CO = 7.2 L/min Impression No evidence of significant coronary artery disease Hemodynamic findings consistent with moderate pulmonary hypertension  MR SHOULDER LEFT WO CONTRAST performed on 11/01/2021 Moderate distal supraspinatus and infraspinatus tendinosis with bursal sided fraying and focal high-grade, partial width bursal sided tear of the far anterior supraspinatus tendon at the footprint, tear measuring 5 mm in width. Moderate distal subscapularis tendinosis. No significant muscle atrophy. No retracted cuff tear. Mild to moderate glenohumeral and mild AC joint osteoarthritis. Mild subacromial-subdeltoid bursitis.  CT CORONARY MORPH W/CTA COR W/SCORE W/CA W/CM &/OR WO/CM performed on 10/20/2021 Coronary calcium score of 151. This was 86th percentile for age and sex matched control. Normal coronary origin with right dominance. Calcified plaque in the mid LAD causing mild stenosis (25%). Minimal stenosis (<25%) in the mid RCA and prox LCx. CAD-RADS 2. Mild non-obstructive CAD (25-49%). Consider  non-atherosclerotic causes of chest pain. Consider preventive therapy and risk factor modification. No acute extracardiac findings  MYOCARDIAL PERFUSION IMAGING STUDY (LEXISCAN) performed on 10/19/2021 Normal left ventricular systolic function with an EF of 58%. No regional wall motion abnormalities SPECT images demonstrated a small mixed perfusion abnormality of mild intensity present in the inferior basal lateral region on stress images with borderline redistribution. No artifact Left ventricular cavity size normal No clear evidence of ischemia Intermediate risk scan  TRANSTHORACIC ECHOCARDIOGRAM performed on 10/19/2021 Normal left ventricular systolic function with an EF of >55% No regional wall motion abnormalities Left ventricular diastolic Doppler parameters consistent with abnormal relaxation (G1DD). Trivial AR, MR, PR Mild TR RVSP = 33.0 mmHg Normal gradients; no valvular stenosis No pericardial effusion  Impression and Plan:  Clennie Sunder has been referred for pre-anesthesia review and clearance prior to him undergoing the planned anesthetic and procedural courses. Available labs, pertinent testing, and imaging results were personally reviewed by me in preparation for upcoming operative/procedural course. [facility] medical record has been updated following extensive record review and patient interview with PAT staff.   This patient has been appropriately cleared by cardiology (MODERATE) and by pulmonary medicine (MODERATE) with the individually indicated risks of significant perioperative cardiovascular/cardiopulmonary complications. Based on clinical review performed today (10/20/22), barring any significant acute changes in the patient's overall condition, it is anticipated that he will be able to proceed with the planned surgical intervention. Any acute changes in clinical condition may necessitate his procedure being postponed and/or cancelled. Patient will meet with  anesthesia team (MD and/or CRNA) on the day of his procedure for preoperative evaluation/assessment. Questions regarding anesthetic course will be fielded at that time.   Pre-surgical instructions were reviewed with the patient during his PAT appointment, and questions were fielded to satisfaction by PAT clinical staff. He has been instructed on which medications that he will need to hold prior to surgery, as well as the ones that have been deemed safe/appropriate to take on the day of his procedure. As part of the general education provided by PAT, patient made aware both verbally and in writing, that he would need to abstain from the use of any illegal substances during his perioperative course.  He was advised that failure to follow the provided instructions could necessitate case cancellation or result in serious perioperative complications up to and including death. Patient encouraged to contact PAT and/or his surgeon's office to discuss any questions or concerns that may arise prior to surgery; verbalized understanding.   Quentin Mulling, MSN, APRN, FNP-C, CEN [facility]  Peri-operative Services Nurse Practitioner Phone: [phone] Fax: [phone] 10/20/22 11:03 AM  NOTE: This note has been prepared using Dragon dictation software. Despite my best ability to proofread, there is always the potential that unintentional transcriptional errors may still occur from this process.

## 2022-10-24 ENCOUNTER — Ambulatory Visit: Payer: Medicare Other | Admitting: Urgent Care

## 2022-10-24 ENCOUNTER — Other Ambulatory Visit: Payer: Self-pay

## 2022-10-24 ENCOUNTER — Ambulatory Visit: Payer: Medicare Other

## 2022-10-24 ENCOUNTER — Ambulatory Visit
Admission: RE | Admit: 2022-10-24 | Discharge: 2022-10-24 | Disposition: A | Discharge status: Home / Self Care | Payer: Medicare Other | Attending: Orthopedic Surgery | Admitting: Orthopedic Surgery

## 2022-10-24 ENCOUNTER — Encounter: Payer: Self-pay | Admitting: Orthopedic Surgery

## 2022-10-24 ENCOUNTER — Encounter
Admission: RE | Disposition: A | Discharge status: Home / Self Care | Payer: Self-pay | Source: Home / Self Care | Attending: Orthopedic Surgery

## 2022-10-24 DIAGNOSIS — E119 Type 2 diabetes mellitus without complications: Secondary | ICD-10-CM

## 2022-10-24 DIAGNOSIS — J449 Chronic obstructive pulmonary disease, unspecified: Secondary | ICD-10-CM | POA: Diagnosis not present

## 2022-10-24 DIAGNOSIS — E1142 Type 2 diabetes mellitus with diabetic polyneuropathy: Secondary | ICD-10-CM | POA: Diagnosis not present

## 2022-10-24 DIAGNOSIS — I272 Pulmonary hypertension, unspecified: Secondary | ICD-10-CM | POA: Insufficient documentation

## 2022-10-24 DIAGNOSIS — M75102 Unspecified rotator cuff tear or rupture of left shoulder, not specified as traumatic: Secondary | ICD-10-CM | POA: Insufficient documentation

## 2022-10-24 DIAGNOSIS — M25812 Other specified joint disorders, left shoulder: Secondary | ICD-10-CM | POA: Diagnosis not present

## 2022-10-24 DIAGNOSIS — I10 Essential (primary) hypertension: Secondary | ICD-10-CM | POA: Diagnosis not present

## 2022-10-24 DIAGNOSIS — Z01812 Encounter for preprocedural laboratory examination: Secondary | ICD-10-CM

## 2022-10-24 DIAGNOSIS — G4700 Insomnia, unspecified: Secondary | ICD-10-CM | POA: Diagnosis not present

## 2022-10-24 DIAGNOSIS — M19012 Primary osteoarthritis, left shoulder: Secondary | ICD-10-CM | POA: Insufficient documentation

## 2022-10-24 DIAGNOSIS — I2510 Atherosclerotic heart disease of native coronary artery without angina pectoris: Secondary | ICD-10-CM | POA: Insufficient documentation

## 2022-10-24 DIAGNOSIS — Z9981 Dependence on supplemental oxygen: Secondary | ICD-10-CM | POA: Diagnosis not present

## 2022-10-24 DIAGNOSIS — G4733 Obstructive sleep apnea (adult) (pediatric): Secondary | ICD-10-CM | POA: Insufficient documentation

## 2022-10-24 HISTORY — DX: Unspecified rotator cuff tear or rupture of left shoulder, not specified as traumatic: M75.102

## 2022-10-24 HISTORY — DX: Obstructive sleep apnea (adult) (pediatric): G47.33

## 2022-10-24 HISTORY — PX: SHOULDER ARTHROSCOPY WITH SUBACROMIAL DECOMPRESSION AND OPEN ROTATOR C: SHX5688

## 2022-10-24 HISTORY — DX: Insomnia, unspecified: G47.00

## 2022-10-24 LAB — GLUCOSE, CAPILLARY
Glucose-Capillary: 181 mg/dL — ABNORMAL HIGH (ref 70–99)
Glucose-Capillary: 171 mg/dL — ABNORMAL HIGH (ref 70–99)

## 2022-10-24 SURGERY — SHOULDER ARTHROSCOPY WITH SUBACROMIAL DECOMPRESSION AND OPEN ROTATOR CUFF REPAIR, OPEN BICEPS TENDON REPAIR
Anesthesia: General | Site: Shoulder | Laterality: Left

## 2022-10-24 MED ORDER — FAMOTIDINE 20 MG PO TABS
ORAL_TABLET | ORAL | Status: AC
  Filled 2022-10-24: qty 1

## 2022-10-24 MED ORDER — ORAL CARE MOUTH RINSE: 15.0000 mL | Freq: Once | OROMUCOSAL | Status: AC

## 2022-10-24 MED ORDER — CEFAZOLIN SODIUM-DEXTROSE 2-4 GM/100ML-% IV SOLN
2.0000 g | INTRAVENOUS | Status: AC
  Administered 2022-10-24: 2 g via INTRAVENOUS

## 2022-10-24 MED ORDER — OXYCODONE HCL 5 MG PO TABS
5.0000 mg | ORAL_TABLET | ORAL | 0 refills | Status: AC | PRN
Start: 2022-10-24 — End: 2023-10-24

## 2022-10-24 MED ORDER — OXYCODONE HCL 5 MG PO TABS
ORAL_TABLET | ORAL | Status: AC
  Filled 2022-10-24: qty 1

## 2022-10-24 MED ORDER — ACETAMINOPHEN 500 MG PO TABS: 1000.0000 mg | ORAL_TABLET | Freq: Three times a day (TID) | ORAL | 2 refills | Status: AC

## 2022-10-24 MED ORDER — DEXMEDETOMIDINE HCL IN NACL 200 MCG/50ML IV SOLN
INTRAVENOUS | Status: DC | PRN
  Administered 2022-10-24: 8 ug via INTRAVENOUS
  Administered 2022-10-24 (×3): 4 ug via INTRAVENOUS

## 2022-10-24 MED ORDER — PHENYLEPHRINE HCL-NACL 20-0.9 MG/250ML-% IV SOLN
INTRAVENOUS | Status: AC
  Filled 2022-10-24: qty 250

## 2022-10-24 MED ORDER — LIDOCAINE HCL (CARDIAC) PF 100 MG/5ML IV SOSY
PREFILLED_SYRINGE | INTRAVENOUS | Status: DC | PRN
  Administered 2022-10-24: 100 mg via INTRAVENOUS

## 2022-10-24 MED ORDER — ONDANSETRON HCL 4 MG/2ML IJ SOLN
INTRAMUSCULAR | Status: DC | PRN
  Administered 2022-10-24: 4 mg via INTRAVENOUS

## 2022-10-24 MED ORDER — OXYCODONE HCL 5 MG PO TABS
5.0000 mg | ORAL_TABLET | ORAL | Status: DC | PRN
  Administered 2022-10-24: 5 mg via ORAL

## 2022-10-24 MED ORDER — PROPOFOL 10 MG/ML IV BOLUS
INTRAVENOUS | Status: AC
  Filled 2022-10-24: qty 20

## 2022-10-24 MED ORDER — MIDAZOLAM HCL 2 MG/2ML IJ SOLN
INTRAMUSCULAR | Status: DC | PRN
  Administered 2022-10-24: 2 mg via INTRAVENOUS

## 2022-10-24 MED ORDER — HYDROMORPHONE HCL 1 MG/ML IJ SOLN: 0.5000 mg | INTRAMUSCULAR | Status: DC | PRN

## 2022-10-24 MED ORDER — CHLORHEXIDINE GLUCONATE 0.12 % MT SOLN
15.0000 mL | Freq: Once | OROMUCOSAL | Status: AC
  Administered 2022-10-24: 15 mL via OROMUCOSAL

## 2022-10-24 MED ORDER — EPINEPHRINE PF 1 MG/ML IJ SOLN
INTRAMUSCULAR | Status: AC
  Filled 2022-10-24: qty 4

## 2022-10-24 MED ORDER — HYDROMORPHONE HCL 1 MG/ML IJ SOLN
INTRAMUSCULAR | Status: AC
  Filled 2022-10-24: qty 1

## 2022-10-24 MED ORDER — HYDROMORPHONE HCL 1 MG/ML IJ SOLN
INTRAMUSCULAR | Status: DC | PRN
Start: 2022-10-24 — End: 2022-10-24
  Administered 2022-10-24: .3 mg via INTRAVENOUS
  Administered 2022-10-24: .4 mg via INTRAVENOUS
  Administered 2022-10-24: .3 mg via INTRAVENOUS

## 2022-10-24 MED ORDER — FENTANYL CITRATE PF 50 MCG/ML IJ SOSY
PREFILLED_SYRINGE | INTRAMUSCULAR | Status: AC
  Filled 2022-10-24: qty 1

## 2022-10-24 MED ORDER — SODIUM CHLORIDE 0.9 % IV SOLN: INTRAVENOUS | Status: DC

## 2022-10-24 MED ORDER — ACETAMINOPHEN 10 MG/ML IV SOLN
INTRAVENOUS | Status: DC | PRN
  Administered 2022-10-24: 1000 mg via INTRAVENOUS

## 2022-10-24 MED ORDER — ACETAMINOPHEN 10 MG/ML IV SOLN
INTRAVENOUS | Status: AC
  Filled 2022-10-24: qty 100

## 2022-10-24 MED ORDER — FENTANYL CITRATE (PF) 100 MCG/2ML IJ SOLN
INTRAMUSCULAR | Status: AC
  Filled 2022-10-24: qty 2

## 2022-10-24 MED ORDER — DEXAMETHASONE SODIUM PHOSPHATE 10 MG/ML IJ SOLN
INTRAMUSCULAR | Status: DC | PRN
  Administered 2022-10-24: 10 mg via INTRAVENOUS

## 2022-10-24 MED ORDER — ONDANSETRON 4 MG PO TBDP: 4.0000 mg | ORAL_TABLET | Freq: Three times a day (TID) | ORAL | 0 refills | Status: AC | PRN

## 2022-10-24 MED ORDER — OXYCODONE HCL 5 MG PO TABS
5.0000 mg | ORAL_TABLET | Freq: Once | ORAL | Status: AC | PRN
  Administered 2022-10-24: 5 mg via ORAL

## 2022-10-24 MED ORDER — FENTANYL CITRATE (PF) 100 MCG/2ML IJ SOLN
INTRAMUSCULAR | Status: DC | PRN
  Administered 2022-10-24 (×2): 50 ug via INTRAVENOUS

## 2022-10-24 MED ORDER — EPHEDRINE SULFATE (PRESSORS) 50 MG/ML IJ SOLN
INTRAMUSCULAR | Status: DC | PRN
  Administered 2022-10-24 (×4): 5 mg via INTRAVENOUS

## 2022-10-24 MED ORDER — KETAMINE HCL 10 MG/ML IJ SOLN
INTRAMUSCULAR | Status: DC | PRN
Start: 2022-10-24 — End: 2022-10-24
  Administered 2022-10-24 (×2): 25 mg via INTRAVENOUS

## 2022-10-24 MED ORDER — FAMOTIDINE 20 MG PO TABS
20.0000 mg | ORAL_TABLET | Freq: Once | ORAL | Status: AC
  Administered 2022-10-24: 20 mg via ORAL

## 2022-10-24 MED ORDER — BUPIVACAINE HCL (PF) 0.5 % IJ SOLN
INTRAMUSCULAR | Status: AC
  Filled 2022-10-24: qty 10

## 2022-10-24 MED ORDER — PROPOFOL 1000 MG/100ML IV EMUL
INTRAVENOUS | Status: AC
  Filled 2022-10-24: qty 100

## 2022-10-24 MED ORDER — BUPIVACAINE LIPOSOME 1.3 % IJ SUSP
INTRAMUSCULAR | Status: AC
  Filled 2022-10-24: qty 20

## 2022-10-24 MED ORDER — SUGAMMADEX SODIUM 200 MG/2ML IV SOLN
INTRAVENOUS | Status: DC | PRN
  Administered 2022-10-24: 200 mg via INTRAVENOUS

## 2022-10-24 MED ORDER — SUCCINYLCHOLINE CHLORIDE 200 MG/10ML IV SOSY
PREFILLED_SYRINGE | INTRAVENOUS | Status: DC | PRN
  Administered 2022-10-24: 100 mg via INTRAVENOUS

## 2022-10-24 MED ORDER — PHENYLEPHRINE HCL-NACL 20-0.9 MG/250ML-% IV SOLN
INTRAVENOUS | Status: DC | PRN
  Administered 2022-10-24: 20 ug/min via INTRAVENOUS

## 2022-10-24 MED ORDER — SODIUM CHLORIDE 0.9 % IV SOLN: INTRAVENOUS | Status: DC | PRN

## 2022-10-24 MED ORDER — FENTANYL CITRATE (PF) 100 MCG/2ML IJ SOLN
25.0000 ug | INTRAMUSCULAR | Status: DC | PRN
  Administered 2022-10-24 (×2): 25 ug via INTRAVENOUS

## 2022-10-24 MED ORDER — RINGERS IRRIGATION IR SOLN
Status: DC | PRN
  Administered 2022-10-24: 18000 mL
  Administered 2022-10-24: 12000 mL

## 2022-10-24 MED ORDER — ASPIRIN 325 MG PO TBEC: 325.0000 mg | DELAYED_RELEASE_TABLET | Freq: Every day | ORAL | 0 refills | Status: AC

## 2022-10-24 MED ORDER — LACTATED RINGERS IV SOLN
INTRAVENOUS | Status: DC | PRN
  Administered 2022-10-24: 12004 mL

## 2022-10-24 MED ORDER — CHLORHEXIDINE GLUCONATE 0.12 % MT SOLN
OROMUCOSAL | Status: AC
  Filled 2022-10-24: qty 15

## 2022-10-24 MED ORDER — PROPOFOL 10 MG/ML IV BOLUS
INTRAVENOUS | Status: DC | PRN
Start: 2022-10-24 — End: 2022-10-24
  Administered 2022-10-24 (×2): 50 mg via INTRAVENOUS
  Administered 2022-10-24: 200 mg via INTRAVENOUS

## 2022-10-24 MED ORDER — MIDAZOLAM HCL 2 MG/2ML IJ SOLN
INTRAMUSCULAR | Status: AC
  Filled 2022-10-24: qty 2

## 2022-10-24 MED ORDER — OXYCODONE HCL 5 MG/5ML PO SOLN: 5.0000 mg | Freq: Once | ORAL | Status: AC | PRN

## 2022-10-24 MED ORDER — ROCURONIUM BROMIDE 100 MG/10ML IV SOLN
INTRAVENOUS | Status: DC | PRN
  Administered 2022-10-24: 70 mg via INTRAVENOUS
  Administered 2022-10-24: 10 mg via INTRAVENOUS

## 2022-10-24 MED ORDER — BUPIVACAINE HCL (PF) 0.5 % IJ SOLN
INTRAMUSCULAR | Status: AC
  Filled 2022-10-24: qty 30

## 2022-10-24 MED ORDER — KETAMINE HCL 50 MG/5ML IJ SOSY
PREFILLED_SYRINGE | INTRAMUSCULAR | Status: AC
  Filled 2022-10-24: qty 5

## 2022-10-24 MED ORDER — BUPIVACAINE HCL 0.5 % IJ SOLN
INTRAMUSCULAR | Status: DC | PRN
  Administered 2022-10-24: 13 mL

## 2022-10-24 MED ORDER — CEFAZOLIN SODIUM-DEXTROSE 2-4 GM/100ML-% IV SOLN
INTRAVENOUS | Status: AC
  Filled 2022-10-24: qty 100

## 2022-10-24 SURGICAL SUPPLY — 87 items
ADAPTER IRRIG TUBE 2 SPIKE SOL (ADAPTER) ×2 IMPLANT
ADH SKN CLS APL DERMABOND .7 (GAUZE/BANDAGES/DRESSINGS)
ADPR TBG 2 SPK PMP STRL ASCP (ADAPTER) ×2
ANCH SUT 2 SUTTK 14.5X3 (Anchor) IMPLANT
ANCH SUT 2 SWLK 19.1 CLS EYLT (Anchor) ×1 IMPLANT
ANCH SUT 2.9 PUSHLOCK ANCH (Orthopedic Implant) ×1 IMPLANT
ANCH SUT 2X2.3 TAPE (Anchor) ×1 IMPLANT
ANCH SUT 5 3.9 CRKSW KNTLS (Anchor) IMPLANT
ANCH SUT SWLK 19.1X4.75 (Anchor) IMPLANT
ANCHOR 2.3 SP SGL 1.2 XBRAID (Anchor) IMPLANT
ANCHOR SWIVELOCK BIO 4.75X19.1 (Anchor) ×1 IMPLANT
APL PRP STRL LF DISP 70% ISPRP (MISCELLANEOUS) ×1
BLADE SHAVER 4.5X7 STR FR (MISCELLANEOUS) ×1 IMPLANT
BNDG ADH 2 X3.75 FABRIC TAN LF (GAUZE/BANDAGES/DRESSINGS) ×1 IMPLANT
BNDG ADH XL 3.75X2 STRCH LF (GAUZE/BANDAGES/DRESSINGS) ×1
BUR BR 5.5 WIDE MOUTH (BURR) IMPLANT
CANNULA PART THRD DISP 5.75X7 (CANNULA) IMPLANT
CANNULA PARTIAL THREAD 2X7 (CANNULA) IMPLANT
CANNULA TWIST IN 8.25X7CM (CANNULA) IMPLANT
CANNULA TWIST IN 8.25X9CM (CANNULA) IMPLANT
CHLORAPREP W/TINT 26 (MISCELLANEOUS) ×1 IMPLANT
COOLER POLAR GLACIER W/PUMP (MISCELLANEOUS) ×1 IMPLANT
DERMABOND ADVANCED .7 DNX12 (GAUZE/BANDAGES/DRESSINGS) IMPLANT
DEVICE SUCT BLK HOLE OR FLOOR (MISCELLANEOUS) ×1 IMPLANT
DRAPE INCISE IOBAN 66X45 STRL (DRAPES) ×1 IMPLANT
DRAPE SHEET LG 3/4 BI-LAMINATE (DRAPES) ×1 IMPLANT
DRAPE U-SHAPE 47X51 STRL (DRAPES) ×2 IMPLANT
DRSG TEGADERM 4X4.75 (GAUZE/BANDAGES/DRESSINGS) ×3 IMPLANT
ELECT REM PT RETURN 9FT ADLT (ELECTROSURGICAL) ×1
ELECTRODE REM PT RTRN 9FT ADLT (ELECTROSURGICAL) ×1 IMPLANT
GAUZE SPONGE 4X4 12PLY STRL (GAUZE/BANDAGES/DRESSINGS) ×1 IMPLANT
GAUZE XEROFORM 1X8 LF (GAUZE/BANDAGES/DRESSINGS) ×1 IMPLANT
GLOVE BIO SURGEON STRL SZ7.5 (GLOVE) ×1 IMPLANT
GLOVE BIOGEL PI IND STRL 8 (GLOVE) ×2 IMPLANT
GLOVE SURG ORTHO 8.0 STRL STRW (GLOVE) ×1 IMPLANT
GLOVE SURG SYN 8.0 (GLOVE) ×1 IMPLANT
GLOVE SURG SYN 8.0 PF PI (GLOVE) ×1 IMPLANT
GOWN STRL REUS W/ TWL LRG LVL3 (GOWN DISPOSABLE) ×2 IMPLANT
GOWN STRL REUS W/TWL LRG LVL3 (GOWN DISPOSABLE) ×2
GOWN STRL REUS W/TWL XL LVL4 (GOWN DISPOSABLE) ×1 IMPLANT
IV LACTATED RINGER IRRG 3000ML (IV SOLUTION) ×12
IV LR IRRIG 3000ML ARTHROMATIC (IV SOLUTION) ×4 IMPLANT
KIT CORKSCREW KNTLS 3.9 S/T/P (INSTRUMENTS) IMPLANT
KIT STABILIZATION SHOULDER (MISCELLANEOUS) ×1 IMPLANT
KIT SUTURETAK 3.0 INSERT PERC (KITS) IMPLANT
KIT TURNOVER KIT A (KITS) ×1 IMPLANT
MANIFOLD NEPTUNE II (INSTRUMENTS) ×2 IMPLANT
MASK FACE SPIDER DISP (MASK) ×1 IMPLANT
MAT ABSORB FLUID 56X50 GRAY (MISCELLANEOUS) ×2 IMPLANT
NDL MAYO CATGUT SZ5 (NEEDLE)
NDL SAFETY ECLIP 18X1.5 (MISCELLANEOUS) ×1 IMPLANT
NDL SCORPION MULTI FIRE (NEEDLE) IMPLANT
NDL SUT 5 .5 CRC TPR PNT MAYO (NEEDLE) IMPLANT
NEEDLE SCORPION MULTI FIRE (NEEDLE) IMPLANT
PACK ARTHROSCOPY SHOULDER (MISCELLANEOUS) ×1 IMPLANT
PAD ARMBOARD 7.5X6 YLW CONV (MISCELLANEOUS) ×1 IMPLANT
PAD WRAPON POLAR SHDR XLG (MISCELLANEOUS) ×1 IMPLANT
PASSER SUT FIRSTPASS SELF (INSTRUMENTS) ×1 IMPLANT
PASSER SUT SWIFTSTITCH HIP CRT (INSTRUMENTS) ×1 IMPLANT
SHAVER BLADE BONE CUTTER 5.5 (BLADE) ×1 IMPLANT
SLEEVE REMOTE CONTROL 5X12 (DRAPES) IMPLANT
SLING ULTRA II M (MISCELLANEOUS) ×1 IMPLANT
SPONGE T-LAP 18X18 ~~LOC~~+RFID (SPONGE) ×1 IMPLANT
STAPLER SKIN PROX 35W (STAPLE) IMPLANT
STRAP SAFETY 5IN WIDE (MISCELLANEOUS) ×1 IMPLANT
SUT ETHILON 3-0 (SUTURE) ×1 IMPLANT
SUT LASSO 90 DEG CVD (SUTURE) IMPLANT
SUT LASSO 90 DEG SD STR (SUTURE) IMPLANT
SUT MNCRL 4-0 (SUTURE) ×1
SUT MNCRL 4-0 27XMFL (SUTURE) ×1
SUT PROLENE 0 CT 2 (SUTURE) IMPLANT
SUT VIC AB 0 CT1 36 (SUTURE) IMPLANT
SUT VIC AB 2-0 CT2 27 (SUTURE) IMPLANT
SUTURE MNCRL 4-0 27XMF (SUTURE) IMPLANT
SUTURE TAPE 1.3 40 TPR END (SUTURE) IMPLANT
SUTURETAPE 1.3 40 TPR END (SUTURE) ×2
SYSTEM FBRTK BICEPS 1.9 DRILL (Anchor) ×1 IMPLANT
SYSTEM IMPL TENODESIS LNT 2.9 (Orthopedic Implant) IMPLANT
TAPE CLOTH 3X10 WHT NS LF (GAUZE/BANDAGES/DRESSINGS) ×1 IMPLANT
TAPE MICROFOAM 4IN (TAPE) ×1 IMPLANT
TRAP FLUID SMOKE EVACUATOR (MISCELLANEOUS) ×1 IMPLANT
TUBE SET DOUBLEFLO INFLOW (TUBING) ×1 IMPLANT
TUBE SET DOUBLEFLO OUTFLOW (TUBING) ×1 IMPLANT
TUBING CONNECTING 10 (TUBING) IMPLANT
WAND WEREWOLF FLOW 90D (MISCELLANEOUS) ×1 IMPLANT
WATER STERILE IRR 500ML POUR (IV SOLUTION) ×1 IMPLANT
WRAPON POLAR PAD SHDR XLG (MISCELLANEOUS) ×1

## 2022-10-24 NOTE — Op Note (Signed)
SURGERY DATE: 10/24/2022   PRE-OP DIAGNOSIS:  1. Left subacromial impingement 2. Left biceps tendinopathy 3. Left rotator cuff tear 4. Left acromioclavicular joint arthritis  POST-OP DIAGNOSIS: 1. Left subacromial impingement 2. Left biceps tendinopathy 3. Left rotator cuff tear 4. Left acromioclavicular joint arthritis  PROCEDURES:  1. Left arthroscopic rotator cuff repair (high-grade partial-thickness bursal sided tear)  2. Left arthroscopic biceps tenodesis 3. Left arthroscopic subacromial decompression 4. Left arthroscopic extensive debridement of shoulder (glenohumeral and subacromial spaces) 5. Left arthroscopic distal clavicle excision   SURGEON: Rosealee Albee, MD   ASSISTANT: Sonny Dandy, PA   ANESTHESIA: Gen with Exparel interscalene block   ESTIMATED BLOOD LOSS: 5cc   DRAINS:  none   TOTAL IV FLUIDS: per anesthesia      SPECIMENS: none   IMPLANTS:  - Arthrex 2.61mm PushLock x 1 - Arthrex 4.19mm SwiveLock x 1 - Iconix SPEED double loaded with 1.2 and 2.9mm tape x 1     OPERATIVE FINDINGS:  Examination under anesthesia: A careful examination under anesthesia was performed.  Passive range of motion was: FF: 150; ER at side: 70; ER in abduction: 100; IR in abduction: 45.  Anterior load shift: NT.  Posterior load shift: NT.  Sulcus in neutral: NT.  Sulcus in ER: NT.     Intra-operative findings: A thorough arthroscopic examination of the shoulder was performed.  The findings are: 1. Biceps tendon: tendinopathy with significant erythema at the biceps anchor and along the length of the intra-articular tendon 2. Superior labrum: erythema and degenerative tearing 3. Posterior labrum and capsule: normal 4. Inferior capsule and inferior recess: normal 5. Glenoid cartilage surface: Normal 6. Supraspinatus attachment: High-grade bursal sided supraspinatus tear involving approximately 80% of the footprint 7. Posterior rotator cuff attachment: normal 8. Humeral head  articular cartilage: normal 9. Rotator interval: significant synovitis 10: Subscapularis tendon: attachment intact 11. Anterior labrum: Degenerative 12. IGHL: normal   OPERATIVE REPORT:    Indications for procedure:  Mario Castro is a 57 y.o. male with approximately 2 years of left shoulder pain.  He has had difficulty with overhead motion since that time with sensations of weakness. Clinical exam and MRI were suggestive of rotator cuff tear, biceps tendinopathy, acromioclavicular joint arthritis and subacromial impingement. After discussion of risks, benefits, and alternatives to surgery, the patient elected to proceed.    Procedure in detail:   I identified Margus Ninneman in the pre-operative holding area.  I marked the operative shoulder with my initials. I reviewed the risks and benefits of the proposed surgical intervention, and the patient wished to proceed.  Interscalene block was not performed due to concern for postoperative breathing given his underlying home O2 use.  The patient was transferred to the operative suite and placed in the beach chair position.     Appropriate IV antibiotics were administered prior to incision. The operative upper extremity was then prepped and draped in standard fashion. A time out was performed confirming the correct extremity, correct patient, and correct procedure.    I then created a standard posterior portal with an 11 blade. The glenohumeral joint was easily entered with a blunt trocar and the arthroscope introduced. The findings of diagnostic arthroscopy are described above. I debrided degenerative tissue including the synovitic tissue about the rotator interval and anterior and superior labrum. I then coagulated the inflamed synovium to obtain hemostasis and reduce the risk of post-operative swelling using an Arthrocare radiofrequency device.   I then turned my attention to the arthroscopic  biceps tenodesis. The Loop n Tack technique was used to  pass a FiberTape through the biceps in a locked fashion adjacent to the biceps anchor.  A hole for a 2.9 mm Arthrex PushLock was drilled in the bicipital groove just superior to the subscapularis tendon insertion.  The biceps tendon was then cut and the biceps anchor complex was debrided down to a stable base on the superior labrum.  The FiberTape was loaded onto the PushLock anchor and impacted into place into the previously drilled hole in the bicipital groove.  This appropriately secured the biceps into the bicipital groove and took it off of tension.   Next, the arthroscope was then introduced into the subacromial space. A direct lateral portal was created with an 11-blade after spinal needle localization. An extensive subacromial bursectomy and debridement was performed using a combination of the shaver and Arthrocare wand. The entire acromial undersurface was exposed and the CA ligament was subperiosteally elevated to expose the anterior acromial hook. A burr was used to create a flat anterior and lateral aspect of the acromion, converting it from a Type 2 to a Type 1 acromion. Care was made to keep the deltoid fascia intact.  I then turned my attention to the arthroscopic distal clavicle excision. I identified the acromioclavicular joint. Surrounding bursal tissue was debrided and the edges of the joint were identified. I used the 5.2mm barrel burr to remove the distal clavicle parallel to the edge of the acromion. I was able to fit two widths of the burr into the space between the distal clavicle and acromion, signifying that I had removed ~36mm of distal clavicle. This was confirmed by viewing anteriorly and introducing a probe with measuring marks from the lateral portal. Hemostasis was achieved with an Arthrocare wand.    Next, I created an accessory posterolateral portal to assist with visualization and instrumentation.  I debrided the poor quality edges of the supraspinatus tendon.  This was a  high-grade V-shaped bursal sided tear of the supraspinatus involving approximately 80% of the footprint.  I was easily able to push a switching stick through the tear into the glenohumeral joint, thus completing the tear.  I then percutaneously placed 1 Iconix SPEED medial row anchor at the articular margin. I then shuttled all 4 strands of tape through the rotator cuff just lateral to the musculotendinous junction using a FirstPass suture passer spanning the anterior to posterior extent of the tear.  Next, 2 side-to-side stitches were passed in an anterior to posterior fashion using a BirdBeak.  These were then tied arthroscopically.  All 4 strands of the medial row sutures were passed through an [employer] anchor.  This was placed approximately 2 cm distal to the lateral edge of the footprint in line with the midportion of the tear with appropriate tensioning of each suture prior to final fixation. There was 1 small dogear one anteriorly.  The knotless mechanism of the SwiveLock anchor was utilized to reduce the dogear. This construct allowed for excellent reapproximation of the rotator cuff to its native footprint without undue tension.  Appropriate compression was achieved. The repair was stable to external and internal rotation.   Fluid was evacuated from the shoulder, and the portals were closed with 3-0 Nylon.  Local anesthetic was injected about the incisions.  Xeroform was applied to the portals. A sterile dressing was applied, followed by a Polar Care sleeve and a SlingShot shoulder immobilizer/sling. The patient was awakened from anesthesia without difficulty and was transferred  to the PACU in stable condition.    Of note, assistance from a PA was essential to performing the surgery.  PA was present for the entire surgery.  PA assisted with patient positioning, retraction, instrumentation, and wound closure. The surgery would have been more difficult and had longer operative time without PA  assistance.   COMPLICATIONS: none   DISPOSITION: plan for discharge home after recovery in PACU     POSTOPERATIVE PLAN: Remain in sling (except hygiene and elbow/wrist/hand RoM exercises as instructed by PT) x 4 weeks and NWB for this time. PT to begin 3-4 days after surgery.  Small/medium rotator cuff repair rehab protocol. ASA 325mg  daily x 2 weeks for DVT ppx.

## 2022-10-24 NOTE — Anesthesia Postprocedure Evaluation (Signed)
Anesthesia Post Note  Patient: Pranesh Spraggs  Procedure(s) Performed: Left shoulder arthroscopic rotator cuff repair, subacromial decompression, distal clavicle excision, and biceps tenodesis (Left: Shoulder)  Patient location during evaluation: PACU Anesthesia Type: General Level of consciousness: awake and alert Pain management: pain level controlled Vital Signs Assessment: post-procedure vital signs reviewed and stable Respiratory status: spontaneous breathing, nonlabored ventilation, respiratory function stable and patient connected to nasal cannula oxygen Cardiovascular status: blood pressure returned to baseline and stable Postop Assessment: no apparent nausea or vomiting Anesthetic complications: no  No notable events documented.   Last Vitals:  Vitals:   10/24/22 1038 10/24/22 1046  BP: (!) 147/92 (!) 161/99  Pulse: 67 69  Resp: 20 (!) 23  Temp: 36.5 C   SpO2: 93% 93%    Last Pain:  Vitals:   10/24/22 1038  TempSrc:   PainSc: Asleep                 Stephanie Coup

## 2022-10-24 NOTE — Anesthesia Procedure Notes (Signed)
Procedure Name: Intubation Date/Time: 10/24/2022 7:40 AM  Performed by: Merlene Pulling, CRNAPre-anesthesia Checklist: Patient identified, Patient being monitored, Timeout performed, Emergency Drugs available and Suction available Patient Re-evaluated:Patient Re-evaluated prior to induction Oxygen Delivery Method: Circle system utilized Preoxygenation: Pre-oxygenation with 100% oxygen Induction Type: IV induction Ventilation: Mask ventilation without difficulty Laryngoscope Size: McGraph and 4 Grade View: Grade I Tube type: Oral Tube size: 7.5 mm Number of attempts: 1 Airway Equipment and Method: Stylet Placement Confirmation: ETT inserted through vocal cords under direct vision, positive ETCO2 and breath sounds checked- equal and bilateral Secured at: 23 cm Tube secured with: Tape Dental Injury: Teeth and Oropharynx as per pre-operative assessment

## 2022-10-24 NOTE — Progress Notes (Signed)
Postop note: Pt's niece, Yvonna Alanis transporting pt home. Per pt, he wears oxygen 24/7 on 2L. Does not have  portable tank here for transport home. RA sat 86%. Pt informed we can't d/c home with oxygen because he's dependent on it.  Pt instructed niece to get the portable tank from home.

## 2022-10-24 NOTE — Anesthesia Preprocedure Evaluation (Signed)
Anesthesia Evaluation  Patient identified by MRN, date of birth, ID band Patient awake    Reviewed: Allergy & Precautions, NPO status , Patient's Chart, lab work & pertinent test results  History of Anesthesia Complications Negative for: history of anesthetic complications  Airway Mallampati: IV  TM Distance: >3 FB Neck ROM: full    Dental  (+) Dental Advidsory Given, Chipped   Pulmonary shortness of breath, at rest and Long-Term Oxygen Therapy, sleep apnea, Continuous Positive Airway Pressure Ventilation and Oxygen sleep apnea , COPD,  COPD inhaler and oxygen dependent   Pulmonary exam normal        Cardiovascular hypertension, On Medications and On Home Beta Blockers + angina with exertion + CAD  Normal cardiovascular exam     Neuro/Psych  PSYCHIATRIC DISORDERS  Depression    negative neurological ROS     GI/Hepatic negative GI ROS, Neg liver ROS,,,  Endo/Other  negative endocrine ROSdiabetes    Renal/GU      Musculoskeletal   Abdominal   Peds  Hematology negative hematology ROS (+)   Anesthesia Other Findings Patient with a PMH of "severe COPD" per patient. Patient states he is on 2-3L of O2 at home at all times. Patient states he gets short of breath getting up to do anything. Had discussion with patient about risks of interscalene block and the risk of decrease diaphragmatic excursion. Patient states he does not want to do a nerve block if its going to make him short of breath. Discussed increased post op pain, and patient indicated he was ok with it and would "get through it". Discussed with surgeon pre op.   Note per Quentin Mulling:   Mario Castro is a 57 y.o. male who is submitted for pre-surgical anesthesia review and clearance prior to him undergoing the above procedure. Patient has never been a smoker. Pertinent PMH includes: CAD, diastolic dysfunction, angina, HTN, HLD, T2DM, COPD, asthma, pulmonary  hypertension, OSAH (requires nocturnal PAP therapy), seizures, OA, tear of LEFT supraspinatus tendon, depression, insomnia.   Patient is followed by cardiology Juliann Pares, MD). He was last seen in the cardiology clinic on 06/14/2022; notes reviewed. At the time of his clinic visit, patient doing well overall from a cardiovascular perspective.  Patient with chronic exertional dyspnea related to his underlying COPD and asthma overlap. Breathing reported to be stable overall and at baseline. He is jointly managed by pulmonary medicine Meredeth Ide, MD). Patient denied any chest pain, PND, orthopnea, palpitations, significant peripheral edema, weakness, fatigue, vertiginous symptoms, or presyncope/syncope. Patient with a past medical history significant for cardiovascular diagnoses. Documented physical exam was grossly benign, providing no evidence of acute exacerbation and/or decompensation of the patient's known cardiovascular conditions.    Myocardial perfusion imaging study was performed on 10/19/2021 revealing a normal left ventricular systolic function with an EF of 58%.  There were no regional wall motion abnormalities.  SPECT images demonstrated a small mixed perfusion abnormality of mild intensity in the inferior basal lateral region on stress images.  There was borderline redistribution.  No clear evidence of ischemia.  Study determined to be intermediate risk.    TTE performed on 10/19/2021 revealed a normal left ventricular systolic function with an EF of >55%.  There were no regional wall motion abnormalities. Left ventricular diastolic Doppler parameters consistent with abnormal relaxation (G1DD).  There was trivial to mild paravalvular regurgitation.  All transvalvular gradients were noted to be normal providing no evidence suggestive of stenosis.  RVSP 33.0 mmHg.  Aorta normal in size  with no evidence of aneurysmal dilatation.    Coronary CTA was performed on 10/20/2021 that demonstrated an  Agatston coronary artery calcium score of 151. This placed patient in the 86th percentile for age, sex, and race matched controls. Calcium depositions noted in the mid LAD (25%), mid RCA (<25%) and proximal LCx (<25%) distributions.     Diagnostic RIGHT/LEFT heart catheterization was performed on 11/22/2021 revealing normal left ventricular systolic function with an EF of 55 to 65%.  LVEDP was mildly elevated.  Coronary anatomy normal with no evidence of significant coronary artery disease.  Hemodynamics: mean PA = 38 mmHg, mean PCWP 31 mmHg, RVEDP = 28 mmHg, mean RA = 30 mmHg, AO saturation, 99%, PA saturation = 78%, and CO = 7.2 L/min.  Findings consistent with moderate pulmonary hypertension.   Blood pressure well controlled at 124/82 mmHg on currently prescribed beta-blocker (atenolol), CCB (amlodipine), alpha-blocker (clonidine), diuretic (HCTZ), and ACEi (lisinopril) therapies.  Patient is on atorvastatin for his HLD diagnosis and ASCVD prevention. T2DM well controlled on currently prescribed regimen; HgbA1c was 6.2% when checked on 04/11/2022.  Of note, since patient was last seen by cardiology, HgbA1c has been rechecked with further improvement to 6.1% on 10/10/2022.  Patient does have an OSAH diagnosis and is reported to be compliant with prescribed nocturnal PAP therapy. Functional capacity is somewhat limited by patient's DOE and other multiple medical comorbidities. With that said, patient is able to complete all of his ADL/IADLs without cardiovascular limitation.  Per the [facility], patient is able to achieve at least 4 METS of physical activity without experiencing any significant degree of angina/anginal equivalent symptoms. No changes were made to his medication regimen during his visit with cardiology.  Patient scheduled to follow-up with outpatient cardiology in 6 months or sooner if needed.   Mario Castro is scheduled for an elective LEFT SHOULDER ARTHROSCOPIC ROTATOR CUFF REPAIR, SUBACROMIAL  DECOMPRESSION, DISTAL CLAVICLE EXCISION, AND BICEPS TENODESIS on 10/24/2022 with Dr. Signa Kell, MD.  Given patient's past medical history significant for cardiovascular and cardiopulmonary diagnoses, presurgical clearances were sought from patient's specialty providers.  Specialty clearances were obtained as follows.    Per pulmonary medicine Meredeth Ide, MD), "this patient is optimized for surgery and may proceed with surgery with an overall MODERATE risk of significant perioperative cardiopulmonary complications".    Per cardiology Juliann Pares, MD), "this patient is optimized for surgery and may proceed with the planned procedural course with a MODERATE risk of significant perioperative cardiovascular complications".   In review of his medication reconciliation, the patient is not noted to be taking any type of anticoagulation or antiplatelet therapies that would need to be held during his perioperative course.   Patient denies previous perioperative complications with anesthesia in the past.  In review his EMR, there are no records available for review pertaining to past procedural/anesthetic courses within the [facility] system.     Past Medical History: No date: Anginal pain (HCC) No date: Arthritis No date: Asthma 10/20/2021: CAD (coronary artery disease)     Comment:  a.) cCTA 10/20/2021: Ca2+ 151 (86th %ile for               age/sex/race matched control), 25% mLAD, <25% mRCA and               pLCx; b.) R/LHC 11/22/2021: normal coronaries No date: Chest pain at rest No date: Controlled type 2 diabetes mellitus without complication,  without long-term current use of insulin (HCC) No date: COPD (chronic obstructive pulmonary disease) (  HCC) No date: Depression No date: Diabetic polyneuropathy associated with type 2 diabetes  mellitus (HCC) 09/26/2021: Diastolic dysfunction     Comment:  a.) TTE 09/26/2021: EF >55%, triv-mild panval regurg,               RVSP 35.3, G1DD; b.) TTE  10/19/2021: EF >55%, triv-mild               panval regurg, RVSP 33, G1DD No date: Dyspnea No date: Episodic ataxia (HCC) No date: Hyperlipidemia No date: Hypertension No date: Insomnia     Comment:  a.) takes trazodone PRN No date: OSA on CPAP 11/22/2021: Pulmonary hypertension (HCC)     Comment:  a.) R/LHC 11/22/2021: EF 55%, mPA 38, mPCWP 31, RVSP 28,              mRA 30, AO sat 99, PA sat 78, CO 7.2 No date: Seizures (HCC) No date: Tear of left supraspinatus tendon  Past Surgical History: No date: COLONOSCOPY No date: HERNIA REPAIR 11/22/2021: RIGHT/LEFT HEART CATH AND CORONARY ANGIOGRAPHY; Bilateral     Comment:  Procedure: RIGHT/LEFT HEART CATH AND CORONARY               ANGIOGRAPHY;  Surgeon: Alwyn Pea, MD;  Location:              [facility];  Service: Cardiovascular;                Laterality: Bilateral; No date: URETER SURGERY  BMI    Body Mass Index: 41.60 kg/m      Reproductive/Obstetrics negative OB ROS                             Anesthesia Physical Anesthesia Plan  ASA: 3  Anesthesia Plan: General ETT and General   Post-op Pain Management: Dilaudid IV   Induction: Intravenous  PONV Risk Score and Plan: 3 and Ondansetron, Dexamethasone, Midazolam and Treatment may vary due to age or medical condition  Airway Management Planned: Oral ETT  Additional Equipment:   Intra-op Plan:   Post-operative Plan: Extubation in OR  Informed Consent: I have reviewed the patients History and Physical, chart, labs and discussed the procedure including the risks, benefits and alternatives for the proposed anesthesia with the patient or authorized representative who has indicated his/her understanding and acceptance.     Dental Advisory Given  Plan Discussed with: Anesthesiologist, CRNA and Surgeon  Anesthesia Plan Comments: (Patient consented for risks of anesthesia including but not limited to:  - adverse reactions to  medications - damage to eyes, teeth, lips or other oral mucosa - nerve damage due to positioning  - sore throat or hoarseness - Damage to heart, brain, nerves, lungs, other parts of body or loss of life  Patient voiced understanding.)       Anesthesia Quick Evaluation

## 2022-10-24 NOTE — H&P (Signed)
Paper H&P to be scanned into permanent record. H&P reviewed. No significant changes noted.  

## 2022-10-24 NOTE — Discharge Instructions (Addendum)
Post-Op Instructions - Rotator Cuff Repair  1. Bracing: You will wear a shoulder immobilizer or sling for 4 weeks.   2. Driving: No driving for 4 weeks post-op.  3. Activity: No active lifting for 2 months. Wrist, hand, and elbow motion only. Avoid lifting the upper arm away from the body except for hygiene. You are permitted to bend and straighten the elbow passively only (no active elbow motion). You may use your hand and wrist for typing, writing, and managing utensils (cutting food). Do not lift more than a coffee cup for 8 weeks.  When sleeping or resting, inclined positions (recliner chair or wedge pillow) and a pillow under the forearm for support may provide better comfort for up to 4 weeks.  Avoid long distance travel for 4 weeks.  Return to normal activities after rotator cuff repair repair normally takes 6 months on average. If rehab goes very well, may be able to do most activities at 4 months, except overhead or contact sports.  4. Physical Therapy: Begins 3-4 days after surgery, and proceed 1 time per week for the first 6 weeks, then 1-2 times per week from weeks 6-20 post-op.  5. Medications:  - You will be provided a prescription for narcotic pain medicine. After surgery, take 1-2 narcotic tablets every 4 hours if needed for severe pain.  - A prescription for anti-nausea medication will be provided in case the narcotic medicine causes nausea - take 1 tablet every 6 hours only if nauseated.   - Take tylenol 1000 mg (2 Extra Strength tablets or 3 regular strength) every 8 hours for pain.  May decrease or stop tylenol 5 days after surgery if you are having minimal pain. - Take ASA 325mg /day x 2 weeks to help prevent DVTs/PEs (blood clots).  - DO NOT take ANY nonsteroidal anti-inflammatory pain medications (Advil, Motrin, Ibuprofen, Aleve, Naproxen, or Naprosyn). These medicines can inhibit healing of your shoulder repair.    If you are taking prescription medication for anxiety,  depression, insomnia, muscle spasm, chronic pain, or for attention deficit disorder, you are advised that you are at a higher risk of adverse effects with use of narcotics post-op, including narcotic addiction/dependence, depressed breathing, death. If you use non-prescribed substances: alcohol, marijuana, cocaine, heroin, methamphetamines, etc., you are at a higher risk of adverse effects with use of narcotics post-op, including narcotic addiction/dependence, depressed breathing, death. You are advised that taking > 50 morphine milligram equivalents (MME) of narcotic pain medication per day results in twice the risk of overdose or death. For your prescription provided: oxycodone 5 mg - taking more than 6 tablets per day would result in > 50 morphine milligram equivalents (MME) of narcotic pain medication. Be advised that we will prescribe narcotics short-term, for acute post-operative pain only - 3 weeks for major operations such as shoulder repair/reconstruction surgeries.     6. Post-Op Appointment:  Your first post-op appointment will be 10-14 days post-op.  7. Work or School: For most, but not all procedures, we advise staying out of work or school for at least 1 to 2 weeks in order to recover from the stress of surgery and to allow time for healing.   If you need a work or school note this can be provided.   8. Smoking: If you are a smoker, you need to refrain from smoking in the postoperative period. The nicotine in cigarettes will inhibit healing of your shoulder repair and decrease the chance of successful repair. Similarly, nicotine containing products (  gum, patches) should be avoided.   Post-operative Brace: Apply and remove the brace you received as you were instructed to at the time of fitting and as described in detail as the brace's instructions for use indicate.  Wear the brace for the period of time prescribed by your physician.  The brace can be cleaned with soap and water and  allowed to air dry only.  Should the brace result in increased pain, decreased feeling (numbness/tingling), increased swelling or an overall worsening of your medical condition, please contact your doctor immediately.  If an emergency situation occurs as a result of wearing the brace after normal business hours, please dial 911 and seek immediate medical attention.  Let your doctor know if you have any further questions about the brace issued to you. Refer to the shoulder sling instructions for use if you have any questions regarding the correct fit of your shoulder sling.  [facility] Customer Care for Troubleshooting: [phone]  Video that illustrates how to properly use a shoulder sling: "Instructions for Proper Use of an Orthopaedic Sling" http://bass.com/   [employer] Abductor sling instructions and diagram: (Blue ball)  Youtube video: https://youtu.be/dpzfU0kGJPw  Please contact your surgeon's office if you have any questions about this shoulder immobilizer.         POLAR CARE INFORMATION  MassAdvertisement.it  How to use [employer] Polar Care [facility] Therapy System?  YouTube   ShippingScam.co.uk  OPERATING INSTRUCTIONS  Start the product With dry hands, connect the transformer to the electrical connection located on the top of the cooler. Next, plug the transformer into an appropriate electrical outlet. The unit will automatically start running at this point.  To stop the pump, disconnect electrical power.  Unplug to stop the product when not in use. Unplugging the Polar Care unit turns it off. Always unplug immediately after use. Never leave it plugged in while unattended. Remove pad.    FIRST ADD WATER TO FILL LINE, THEN ICE---Replace ice when existing ice is almost melted  1 Discuss Treatment with your Licensed Health Care Practitioner and Use Only as Prescribed 2 Apply Insulation Barrier & Cold Therapy Pad 3 Check for Moisture 4  Inspect Skin Regularly  Tips and Trouble Shooting Usage Tips 1. Use cubed or chunked ice for optimal performance. 2. It is recommended to drain the Pad between uses. To drain the pad, hold the Pad upright with the hose pointed toward the ground. Depress the black plunger and allow water to drain out. 3. You may disconnect the Pad from the unit without removing the pad from the affected area by depressing the silver tabs on the hose coupling and gently pulling the hoses apart. The Pad and unit will seal itself and will not leak. Note: Some dripping during release is normal. 4. DO NOT RUN PUMP WITHOUT WATER! The pump in this unit is designed to run with water. Running the unit without water will cause permanent damage to the pump. 5. Unplug unit before removing lid.  TROUBLESHOOTING GUIDE Pump not running, Water not flowing to the pad, Pad is not getting cold 1. Make sure the transformer is plugged into the wall outlet. 2. Confirm that the ice and water are filled to the indicated levels. 3. Make sure there are no kinks in the pad. 4. Gently pull on the blue tube to make sure the tube/pad junction is straight. 5. Remove the pad from the treatment site and ll it while the pad is lying at; then reapply. 6. Confirm that the  pad couplings are securely attached to the unit. Listen for the double clicks (Figure 1) to confirm the pad couplings are securely attached.  Leaks    Note: Some condensation on the lines, controller, and pads is unavoidable, especially in warmer climates. 1. If using a [employer] Polar Care Cold Therapy unit with a detachable Cold Therapy Pad, and a leak exists (other than condensation on the lines) disconnect the pad couplings. Make sure the silver tabs on the couplings are depressed before reconnecting the pad to the pump hose; then confirm both sides of the coupling are properly clicked in. 2. If the coupling continues to leak or a leak is detected in the pad itself, stop using it  and call [employer] Customer Care at [phone].  Cleaning After use, empty and dry the unit with a soft cloth. Warm water and mild detergent may be used occasionally to clean the pump and tubes.  WARNING: The Polar Care Cube can be cold enough to cause serious injury, including full skin necrosis. Follow these Operating Instructions, and carefully read the Product Insert (see pouch on side of unit) and the Cold Therapy Pad Fitting Instructions (provided with each Cold Therapy Pad) prior to use.       AMBULATORY SURGERY  DISCHARGE INSTRUCTIONS   The drugs that you were given will stay in your system until tomorrow so for the next 24 hours you should not:  Drive an automobile Make any legal decisions Drink any alcoholic beverage   You may resume regular meals tomorrow.  Today it is better to start with liquids and gradually work up to solid foods.  You may eat anything you prefer, but it is better to start with liquids, then soup and crackers, and gradually work up to solid foods.   Please notify your doctor immediately if you have any unusual bleeding, trouble breathing, redness and pain at the surgery site, drainage, fever, or pain not relieved by medication.    Additional Instructions:        Please contact your physician with any problems or Same Day Surgery at [phone], Monday through Friday 6 am to 4 pm, or [place] at [facility] number at [phone].

## 2022-10-24 NOTE — Transfer of Care (Signed)
Immediate Anesthesia Transfer of Care Note  Patient: Mario Castro  Procedure(s) Performed: Left shoulder arthroscopic rotator cuff repair, subacromial decompression, distal clavicle excision, and biceps tenodesis (Left: Shoulder)  Patient Location: PACU  Anesthesia Type:General  Level of Consciousness: drowsy  Airway & Oxygen Therapy: Patient connected to face mask oxygen  Post-op Assessment: Report given to RN and Post -op Vital signs reviewed and stable  Post vital signs: Reviewed  Last Vitals:  Vitals Value Taken Time  BP 161/99 10/24/22 1046  Temp 36.5 C 10/24/22 1038  Pulse 73 10/24/22 1048  Resp 21 10/24/22 1048  SpO2 95 % 10/24/22 1048  Vitals shown include unfiled device data.  Last Pain:  Vitals:   10/24/22 1038  TempSrc:   PainSc: Asleep         Complications: No notable events documented.

## 2022-11-20 ENCOUNTER — Encounter: Payer: Self-pay | Admitting: Orthopedic Surgery

## 2023-07-20 ENCOUNTER — Emergency Department

## 2023-07-20 ENCOUNTER — Other Ambulatory Visit: Payer: Self-pay

## 2023-07-20 ENCOUNTER — Encounter: Payer: Self-pay | Admitting: Intensive Care

## 2023-07-20 ENCOUNTER — Emergency Department
Admission: EM | Admit: 2023-07-20 | Discharge: 2023-07-20 | Disposition: A | Discharge status: Home / Self Care | Attending: Emergency Medicine | Admitting: Emergency Medicine

## 2023-07-20 DIAGNOSIS — I10 Essential (primary) hypertension: Secondary | ICD-10-CM | POA: Insufficient documentation

## 2023-07-20 DIAGNOSIS — M79602 Pain in left arm: Secondary | ICD-10-CM | POA: Insufficient documentation

## 2023-07-20 DIAGNOSIS — J449 Chronic obstructive pulmonary disease, unspecified: Secondary | ICD-10-CM | POA: Diagnosis not present

## 2023-07-20 DIAGNOSIS — R079 Chest pain, unspecified: Secondary | ICD-10-CM | POA: Diagnosis present

## 2023-07-20 DIAGNOSIS — R600 Localized edema: Secondary | ICD-10-CM | POA: Diagnosis not present

## 2023-07-20 DIAGNOSIS — E119 Type 2 diabetes mellitus without complications: Secondary | ICD-10-CM | POA: Insufficient documentation

## 2023-07-20 LAB — BASIC METABOLIC PANEL WITH GFR
Anion gap: 11 (ref 5–15)
BUN: 14 mg/dL (ref 6–20)
CO2: 30 mmol/L (ref 22–32)
Calcium: 9 mg/dL (ref 8.9–10.3)
Chloride: 98 mmol/L (ref 98–111)
Creatinine, Ser: 0.73 mg/dL (ref 0.61–1.24)
GFR, Estimated: >60 mL/min (ref >60)
Glucose, Bld: 118 mg/dL — ABNORMAL HIGH (ref 70–99)
Potassium: 3.8 mmol/L (ref 3.5–5.1)
Sodium: 139 mmol/L (ref 135–145)

## 2023-07-20 LAB — CBC
HCT: 42.5 % (ref 39.0–52.0)
Hemoglobin: 14.2 g/dL (ref 13.0–17.0)
MCH: 29.7 pg (ref 26.0–34.0)
MCHC: 33.4 g/dL (ref 30.0–36.0)
MCV: 88.9 fL (ref 80.0–100.0)
Platelets: 241 10*3/uL (ref 150–400)
RBC: 4.78 MIL/uL (ref 4.22–5.81)
RDW: 12.1 % (ref 11.5–15.5)
WBC: 9.1 10*3/uL (ref 4.0–10.5)
nRBC: 0 % (ref 0.0–0.2)

## 2023-07-20 LAB — BRAIN NATRIURETIC PEPTIDE: B Natriuretic Peptide: 56.4 pg/mL (ref 0.0–100.0)

## 2023-07-20 LAB — TROPONIN I (HIGH SENSITIVITY)
Troponin I (High Sensitivity): 3 ng/L (ref <18)
Troponin I (High Sensitivity): 3 ng/L (ref <18)

## 2023-07-20 LAB — D-DIMER, QUANTITATIVE: D-Dimer, Quant: <0.27 ug{FEU}/mL (ref 0.00–0.50)

## 2023-07-20 MED ORDER — NITROGLYCERIN 0.4 MG SL SUBL: 0.4000 mg | SUBLINGUAL_TABLET | Freq: Once | SUBLINGUAL | Status: DC

## 2023-07-20 NOTE — ED Provider Notes (Signed)
 [facility] Provider Note    Event Date/Time   First MD Initiated Contact with Patient 07/20/23 1607     (approximate)   History   Chest Pain   HPI Mario Castro is a 58 y.o. male with history of pulmonary hypertension, COPD chronically on 2 L, DM2, HTN, HLD presenting today for chest pain.  Patient states over the past 2 to 3 days he has had worsening chest pain on his left side which radiates into his left arm.  It is a constant pressure but sometimes worse when he gets up and moves around as well as when he takes a big deep breath.  Denies any pain with movement of his arms or his chest.  No recent trauma.  No worsening shortness of breath from his baseline.  Denies nausea, vomiting, diaphoresis.  Chronically has leg swelling which has not changed recently.  No cough or congestion different from his baseline.  Chart review: Had left heart cath in 2023 which was largely unremarkable outside of pulmonary hypertension     Physical Exam   Triage Vital Signs: ED Triage Vitals [07/20/23 1512]  Encounter Vitals Group     BP (!) 189/98     Systolic BP Percentile      Diastolic BP Percentile      Pulse Rate 70     Resp 20     Temp 98.5 F (36.9 C)     Temp Source Oral     SpO2 99 %     Weight 255 lb (115.7 kg)     Height 5\' 5"  (1.651 m)     Head Circumference      Peak Flow      Pain Score 10     Pain Loc      Pain Education      Exclude from Growth Chart     Most recent vital signs: Vitals:   07/20/23 1512 07/20/23 1649  BP: (!) 189/98 (!) 159/80  Pulse: 70 65  Resp: 20 19  Temp: 98.5 F (36.9 C)   SpO2: 99% 100%   Physical Exam: I have reviewed the vital signs and nursing notes. General: Awake, alert, no acute distress.  Nontoxic appearing. Head:  Atraumatic, normocephalic.   ENT:  EOM intact, PERRL. Oral mucosa is pink and moist with no lesions. Neck: Neck is supple with full range of motion, No meningeal signs. Cardiovascular:   RRR, No murmurs. Peripheral pulses palpable and equal bilaterally. Respiratory:  Symmetrical chest wall expansion.  No rhonchi, rales, or wheezes.  Good air movement throughout.  No use of accessory muscles.   Musculoskeletal:  No cyanosis. 1+ pitting edema to bilateral lower extremities. Moving extremities with full ROM Abdomen:  Soft, nontender, nondistended. Neuro:  GCS 15, moving all four extremities, interacting appropriately. Speech clear. Psych:  Calm, appropriate.   Skin:  Warm, dry, no rash.    ED Results / Procedures / Treatments   Labs (all labs ordered are listed, but only abnormal results are displayed) Labs Reviewed  BASIC METABOLIC PANEL WITH GFR - Abnormal; Notable for the following components:      Result Value   Glucose, Bld 118 (*)    All other components within normal limits  CBC  D-DIMER, QUANTITATIVE  BRAIN NATRIURETIC PEPTIDE  TROPONIN I (HIGH SENSITIVITY)  TROPONIN I (HIGH SENSITIVITY)     EKG My EKG interpretation: Rate of 70, normal sinus rhythm, normal axis, normal intervals.  No acute ST elevations or depressions.  Does have T wave inversion in lead III.  This was not present on EKG on 10/17/2022.   RADIOLOGY Independently interpreted chest x-ray with no acute pathology   PROCEDURES:  Critical Care performed: No  Procedures   MEDICATIONS ORDERED IN ED: Medications  nitroGLYCERIN  (NITROSTAT ) SL tablet 0.4 mg (0 mg Sublingual Hold 07/20/23 1655)     IMPRESSION / MDM / ASSESSMENT AND PLAN / ED COURSE  I reviewed the triage vital signs and the nursing notes.                              Differential diagnosis includes, but is not limited to, unstable angina, other ACS, CHF, pneumonia, lower suspicion COPD exacerbation, lower suspicion PE, symptomatic hypertension  Patient's presentation is most consistent with acute presentation with potential threat to life or bodily function.  Patient is a 58 year old male presenting today with  left-sided chest pain radiating to the arm that is present both at rest and with ambulation.  No other obvious associated symptoms although has chronic shortness of breath so often times difficult to differentiate.  Vital signs show hypertension but no other acute abnormalities.  1+ pitting edema to lower extremities otherwise exam unremarkable.  Does not appear to be musculoskeletal as there is no tenderness to palpation or pain with rotating his trunk or his arms.  EKG with no obvious ST elevation or depression but does have new T wave inversion in lead III.  CBC and BMP unremarkable.  Patient's troponin negative x 2.  BNP normal limits and D-dimer negative.  Went to reassess patient and he stated that he is not actually having constant chest pain and is just left shoulder pain.  Very intermittently has some chest pain which does not worsen with ambulation nor is it present at rest.  Denies having any chest pain while here in the ED.  Symptoms seem to stem mostly from his left shoulder where he had rotator cuff surgery.  His orthopedic team is following him for that right now.  I discussed with him that it is less likely this is a cardiac etiology and he agrees with this.  No indication for admission and he prefers to be discharged at this time and follow-up outpatient with his teams.  Do think he is stable for this and was given strict return precautions which she is agreeable with.  The patient is on the cardiac monitor to evaluate for evidence of arrhythmia and/or significant heart rate changes.     FINAL CLINICAL IMPRESSION(S) / ED DIAGNOSES   Final diagnoses:  Left arm pain  Chest pain, unspecified type     Rx / DC Orders   ED Discharge Orders     None        Note:  This document was prepared using Dragon voice recognition software and may include unintentional dictation errors.   Kandee Orion, MD 07/20/23 [phone]

## 2023-07-20 NOTE — ED Notes (Signed)
 Pt states he has been having L arm pain for the past few days but today he started to have L sided CP and L arm pain. Pt states pain in his L arm is constant but CP pain is intermittent, pt endorses CP worsens on inspiration, pt denies HX of blood clots. Pt denies HX of MI's. NAD noted at this time.

## 2023-07-20 NOTE — Discharge Instructions (Signed)
 Please follow-up with your primary care provider as well as your cardiology team.  If you have ongoing left shoulder pain, discussed with with your orthopedics team.  You can take ibuprofen 600 mg every 8 hours for the next couple of days to see if it helps with your left shoulder pain.

## 2023-07-20 NOTE — ED Triage Notes (Signed)
 Patient c/o left sided chest pain with radiation to left arm. Started this AM. A&O x4 during triage  Wears 2L O2 continuously  History COPD

## 2023-07-20 NOTE — ED Notes (Signed)
 SL nitroglycerin  held for now due to pt denying CP at this time and only endorsing L arm pain, MD aware.    Pt on cardiac monitor.

## 2023-08-17 ENCOUNTER — Other Ambulatory Visit: Payer: Self-pay | Admitting: Neurology

## 2023-08-17 DIAGNOSIS — M5412 Radiculopathy, cervical region: Secondary | ICD-10-CM

## 2023-08-22 ENCOUNTER — Ambulatory Visit
Admission: RE | Admit: 2023-08-22 | Discharge: 2023-08-22 | Disposition: A | Discharge status: Home / Self Care | Source: Ambulatory Visit | Attending: Neurology | Admitting: Neurology

## 2023-08-22 DIAGNOSIS — M5412 Radiculopathy, cervical region: Secondary | ICD-10-CM | POA: Insufficient documentation

## 2023-10-22 ENCOUNTER — Other Ambulatory Visit: Payer: Self-pay | Admitting: Family Medicine

## 2023-10-22 DIAGNOSIS — M5412 Radiculopathy, cervical region: Secondary | ICD-10-CM

## 2023-11-19 NOTE — Discharge Instructions (Signed)

## 2023-11-20 ENCOUNTER — Ambulatory Visit
Admission: RE | Admit: 2023-11-20 | Discharge: 2023-11-20 | Disposition: A | Discharge status: Home / Self Care | Source: Ambulatory Visit | Attending: Family Medicine | Admitting: Family Medicine

## 2023-11-20 DIAGNOSIS — M5412 Radiculopathy, cervical region: Secondary | ICD-10-CM

## 2023-11-20 MED ORDER — IOPAMIDOL (ISOVUE-300) INJECTION 61%
15.0000 mL | Freq: Once | INTRAVENOUS | Status: AC | PRN
  Administered 2023-11-20: 3 mL

## 2023-11-20 MED ORDER — TRIAMCINOLONE ACETONIDE 40 MG/ML IJ SUSP (RADIOLOGY)
60.0000 mg | Freq: Once | INTRAMUSCULAR | Status: AC
Start: 2023-11-20 — End: 2023-11-20
  Administered 2023-11-20: 60 mg via EPIDURAL

## 8190-05-05 DEATH — deceased
